# Patient Record
Sex: Female | Born: 1980 | Race: White | Hispanic: No | Marital: Married | Smoking: Never smoker
Health system: Southern US, Community
[De-identification: ages and names within clinical notes are randomized; demographics above are authoritative.]

## PROBLEM LIST (undated history)

## (undated) DIAGNOSIS — M419 Scoliosis, unspecified: Secondary | ICD-10-CM

## (undated) HISTORY — PX: SPINAL FUSION: SHX223

## (undated) HISTORY — DX: Scoliosis, unspecified: M41.9

---

## 2012-07-11 NOTE — L&D Delivery Note (Signed)
Delivery Note At 10:22 PM a viable and healthy female was delivered via Vaginal, Spontaneous Delivery (Presentation: Right Occiput Anterior).  APGAR: 8, 9; weight .   Placenta status: Intact, Spontaneous.  Cord: 3 vessels with the following complications: Short.  Anesthesia: None  Episiotomy: None Lacerations: None Suture Repair: n/a Est. Blood Loss (mL): 200  Pt is 32 yo G3 now P3003 who presented with SOOL and now s/p NSVD. 3rd stage of labor was completed with traction and intact 3 vessel cord was delivered. No lacerations visualized. Hemostasis was achieved with fundal massage prior to leaving room. Counts correct.  Mom to postpartum.  Baby to Couplet care / Skin to Skin.  Pior, Jearld Lesch 06/25/2013, 10:42 PM  I was present for the delivery and agree with above.  [place], CNM 06/26/2013 12:35 AM

## 2013-02-27 ENCOUNTER — Encounter: Payer: Self-pay | Admitting: Obstetrics and Gynecology

## 2013-02-27 ENCOUNTER — Ambulatory Visit (INDEPENDENT_AMBULATORY_CARE_PROVIDER_SITE_OTHER): Payer: PRIVATE HEALTH INSURANCE | Admitting: Obstetrics and Gynecology

## 2013-02-27 VITALS — BP 119/72 | Ht 63.0 in | Wt 140.0 lb

## 2013-02-27 DIAGNOSIS — Z3482 Encounter for supervision of other normal pregnancy, second trimester: Secondary | ICD-10-CM

## 2013-02-27 DIAGNOSIS — Z3480 Encounter for supervision of other normal pregnancy, unspecified trimester: Secondary | ICD-10-CM | POA: Insufficient documentation

## 2013-02-27 NOTE — Progress Notes (Signed)
Patient moved from [place] with prenatal records which have been reviewed. Some information missing such as pap smear and anatomy scan. Per report both are normal. Patient is currently without any complaints. Reviewed scope pf our practise. All questions were answered.

## 2013-02-27 NOTE — Progress Notes (Signed)
Patient recently moved here from [place] where she received prenatal care.  She was diagnosed with subchorionic hemorhage. Marland Kitchen

## 2013-03-26 ENCOUNTER — Ambulatory Visit (INDEPENDENT_AMBULATORY_CARE_PROVIDER_SITE_OTHER): Payer: PRIVATE HEALTH INSURANCE | Admitting: Obstetrics & Gynecology

## 2013-03-26 ENCOUNTER — Encounter: Payer: Self-pay | Admitting: Obstetrics & Gynecology

## 2013-03-26 VITALS — BP 128/77 | Wt 139.0 lb

## 2013-03-26 DIAGNOSIS — Z1389 Encounter for screening for other disorder: Secondary | ICD-10-CM

## 2013-03-26 DIAGNOSIS — Z0489 Encounter for examination and observation for other specified reasons: Secondary | ICD-10-CM

## 2013-03-26 DIAGNOSIS — Z3480 Encounter for supervision of other normal pregnancy, unspecified trimester: Secondary | ICD-10-CM

## 2013-03-26 DIAGNOSIS — Z3482 Encounter for supervision of other normal pregnancy, second trimester: Secondary | ICD-10-CM

## 2013-03-26 NOTE — Progress Notes (Signed)
P-[id number]  

## 2013-03-26 NOTE — Progress Notes (Signed)
Counseled about Tdap and flu vaccines, patient will think about them and decide by next visit. 1 hr GTT, labs next visit. Ultrasound ordered given inadequate anatomy scan at her previous OB office.  No other complaints or concerns.  Fetal movement and labor precautions reviewed.

## 2013-03-26 NOTE — Patient Instructions (Signed)
Tetanus, Diphtheria (Td) or Tetanus, Diphtheria, Pertussis (Tdap) Vaccine What You Need to Know WHY GET VACCINATED? Tetanus, diphtheria and pertussis can be very serious diseases. TETANUS (Lockjaw) causes painful muscle spasms and stiffness, usually all over the body.  Tetanus can lead to tightening of muscles in the head and neck so the victim cannot open his mouth or swallow, or sometimes even breathe. Tetanus kills about 1 out of 5 people who are infected. DIPHTHERIA can cause a thick membrane to cover the back of the throat.  Diphtheria can lead to breathing problems, paralysis, heart failure, and even death. PERTUSSIS (Whooping Cough) causes severe coughing spells which can lead to difficulty breathing, vomiting, and disturbed sleep.  Pertussis can lead to weight loss, incontinence, rib fractures and passing out from violent coughing. Up to 2 in 100 adolescents and 5 in 100 adults with pertussis are hospitalized or have complications, including pneumonia and death. These 3 diseases are all caused by bacteria. Diphtheria and pertussis are spread from person to person. Tetanus enters the body through cuts, scratches, or wounds. The [place] saw as many as 200,000 cases a year of diphtheria and pertussis before vaccines were available, and hundreds of cases of tetanus. Since then, tetanus and diphtheria cases have dropped by about 99% and pertussis cases by about 92%. Children 6 years of age and younger get DTaP vaccine to protect them from these three diseases. But older children, adolescents, and adults need protection too. VACCINES FOR ADOLESCENTS AND ADULTS: TD AND TDAP Two vaccines are available to protect people 7 years of age and older from these diseases:  Td vaccine has been used for many years. It protects against tetanus and diphtheria.  Tdap vaccine was licensed in 2005. It is the first vaccine for adolescents and adults that protects against pertussis as well as tetanus and  diphtheria. A Td booster dose is recommended every 10 years. Tdap is given only once. WHICH VACCINE, AND WHEN? Ages 7 through 18 years  A dose of Tdap is recommended at age 11 or 12. This dose could be given as early as age 7 for children who missed one or more childhood doses of DTaP.  Children and adolescents who did not get a complete series of DTaP shots by age 7 should complete the series using a combination of Td and Tdap. Age 19 years and Older  All adults should get a booster dose of Td every 10 years. Adults under 65 who have never gotten Tdap should get a dose of Tdap as their next booster dose. Adults 65 and older may get one booster dose of Tdap.  Adults (including women who may become pregnant and adults 65 and older) who expect to have close contact with a baby younger than 12 months of age should get a dose of Tdap to help protect the baby from pertussis.  Healthcare professionals who have direct patient contact in hospitals or clinics should get one dose of Tdap. Protection After a Wound  A person who gets a severe cut or burn might need a dose of Td or Tdap to prevent tetanus infection. Tdap should be used for anyone who has never had a dose previously. Td should be used if Tdap is not available, or for:  Anybody who has already had a dose of Tdap.  Children 7 through 9 years of age who completed the childhood DTaP series.  Adults 65 and older. Pregnant Women  Pregnant women who have never had a dose of Tdap   should get one, after the 20th week of gestation and preferably during the 3rd trimester. If they do not get Tdap during their pregnancy they should get a dose as soon as possible after delivery. Pregnant women who have previously received Tdap and need tetanus or diphtheria vaccine while pregnant should get Td. Tdap and Td may be given at the same time as other vaccines. SOME PEOPLE SHOULD NOT BE VACCINATED OR SHOULD WAIT  Anyone who has had a life-threatening  allergic reaction after a dose of any tetanus, diphtheria, or pertussis containing vaccine should not get Td or Tdap.  Anyone who has a severe allergy to any component of a vaccine should not get that vaccine. Tell your doctor if the person getting the vaccine has any severe allergies.  Anyone who had a coma, or long or multiple seizures within 7 days after a dose of DTP or DTaP should not get Tdap, unless a cause other than the vaccine was found. These people may get Td.  Talk to your doctor if the person getting either vaccine:  Has epilepsy or another nervous system problem.  Had severe swelling or severe pain after a previous dose of DTP, DTaP, DT, Td, or Tdap vaccine.  Has had Guillain Barr Syndrome (GBS). Anyone who has a moderate or severe illness on the day the shot is scheduled should usually wait until they recover before getting Tdap or Td vaccine. A person with a mild illness or low fever can usually be vaccinated. WHAT ARE THE RISKS FROM TDAP AND TD VACCINES? With a vaccine, as with any medicine, there is always a small risk of a life-threatening allergic reaction or other serious problem. Brief fainting spells and related symptoms (such as jerking movements) can happen after any medical procedure, including vaccination. Sitting or lying down for about 15 minutes after a vaccination can help prevent fainting and injuries caused by falls. Tell your doctor if the patient feels dizzy or lightheaded, or has vision changes or ringing in the ears. Getting tetanus, diphtheria, or pertussis would be much more likely to lead to severe problems than getting either Td or Tdap vaccine. Problems reported after Td and Tdap vaccines are listed below. Mild Problems (noticeable, but did not interfere with activities) Tdap  Pain (about 3 in 4 adolescents and 2 in 3 adults).  Redness or swelling (about 1 in 5).  Mild fever of at least 100.4 F (38 C) (up to about 1 in 25 adolescents and 1 in  100 adults).  Headache (about 4 in 10 adolescents and 3 in 10 adults).  Tiredness (about 1 in 3 adolescents and 1 in 4 adults).  Nausea, vomiting, diarrhea, or stomach ache (up to 1 in 4 adolescents and 1 in 10 adults).  Chills, body aches, sore joints, rash, or swollen glands (uncommon). Td  Pain (up to about 8 in 10).  Redness or swelling at the injection site (up to about 1 in 3).  Mild fever (up to about 1 in 15).  Headache or tiredness (uncommon). Moderate Problems (interfered with activities, but did not require medical attention) Tdap  Pain at the injection site (about 1 in 20 adolescents and 1 in 100 adults).  Redness or swelling at the injection site (up to about 1 in 16 adolescents and 1 in 25 adults).  Fever over 102 F (38.9 C) (about 1 in 100 adolescents and 1 in 250 adults).  Headache (1 in 300).  Nausea, vomiting, diarrhea, or stomach ache (up to 3   in 100 adolescents and 1 in 100 adults). Td  Fever over 102 F (38.9 C) (rare). Tdap or Td  Extensive swelling of the arm where the shot was given (up to about 3 in 100). Severe Problems (Unable to perform usual activities; required medical attention) Tdap or Td  Swelling, severe pain, bleeding, and redness in the arm where the shot was given (rare). A severe allergic reaction could occur after any vaccine. They are estimated to occur less than once in a million doses. WHAT IF THERE IS A SEVERE REACTION? What should I look for? Any unusual condition, such as a severe allergic reaction or a high fever. If a severe allergic reaction occurred, it would be within a few minutes to an hour after the shot. Signs of a serious allergic reaction can include difficulty breathing, weakness, hoarseness or wheezing, a fast heartbeat, hives, dizziness, paleness, or swelling of the throat. What should I do?  Call a doctor, or get the person to a doctor right away.  Tell your doctor what happened, the date and time it  happened, and when the vaccination was given.  Ask your provider to report the reaction by filing a Vaccine Adverse Event Reporting System (VAERS) form. Or, you can file this report through the VAERS website at www.vaers.hhs.gov or by calling 1-800-822-7967. VAERS does not provide medical advice. THE NATIONAL VACCINE INJURY COMPENSATION PROGRAM The National Vaccine Injury Compensation Program (VICP) was created in 1986. Persons who believe they may have been injured by a vaccine can learn about the program and about filing a claim by calling 1-800-338-2382 or visiting the VICP website at www.hrsa.gov/vaccinecompensation. HOW CAN I LEARN MORE?  Your doctor can give you the vaccine package insert or suggest other sources of information.  Call your local or state health department.  Contact the Centers for Disease Control and Prevention (CDC):  Call 1-800-232-4636 (1-800-CDC-INFO).  Visit the CDC website at www.cdc.gov/vaccines. CDC Td and Tdap Interim VIS (08/03/10) Document Released: 04/24/2006 Document Revised: 09/19/2011 Document Reviewed: 08/03/2010 Exit[employer] Patient Information 2014 [employer].  

## 2013-04-02 ENCOUNTER — Other Ambulatory Visit: Payer: Self-pay | Admitting: Obstetrics & Gynecology

## 2013-04-02 ENCOUNTER — Ambulatory Visit (HOSPITAL_COMMUNITY)
Admission: RE | Admit: 2013-04-02 | Discharge: 2013-04-02 | Disposition: A | Discharge status: Home / Self Care | Payer: 59 | Source: Ambulatory Visit | Attending: Obstetrics & Gynecology | Admitting: Obstetrics & Gynecology

## 2013-04-02 DIAGNOSIS — Z0489 Encounter for examination and observation for other specified reasons: Secondary | ICD-10-CM

## 2013-04-02 DIAGNOSIS — Z3482 Encounter for supervision of other normal pregnancy, second trimester: Secondary | ICD-10-CM

## 2013-04-02 DIAGNOSIS — Z363 Encounter for antenatal screening for malformations: Secondary | ICD-10-CM | POA: Insufficient documentation

## 2013-04-02 DIAGNOSIS — Z1389 Encounter for screening for other disorder: Secondary | ICD-10-CM | POA: Insufficient documentation

## 2013-04-02 DIAGNOSIS — O358XX0 Maternal care for other (suspected) fetal abnormality and damage, not applicable or unspecified: Secondary | ICD-10-CM | POA: Insufficient documentation

## 2013-04-03 ENCOUNTER — Encounter: Payer: Self-pay | Admitting: Obstetrics & Gynecology

## 2013-04-09 ENCOUNTER — Encounter: Payer: Self-pay | Admitting: Obstetrics & Gynecology

## 2013-04-09 ENCOUNTER — Ambulatory Visit (INDEPENDENT_AMBULATORY_CARE_PROVIDER_SITE_OTHER): Payer: PRIVATE HEALTH INSURANCE | Admitting: Obstetrics & Gynecology

## 2013-04-09 VITALS — BP 126/73 | Wt 141.0 lb

## 2013-04-09 DIAGNOSIS — Z3480 Encounter for supervision of other normal pregnancy, unspecified trimester: Secondary | ICD-10-CM

## 2013-04-09 DIAGNOSIS — Z3482 Encounter for supervision of other normal pregnancy, second trimester: Secondary | ICD-10-CM

## 2013-04-09 LAB — CBC
MCH: 30.8 pg (ref 26.0–34.0)
MCHC: 34 g/dL (ref 30.0–36.0)
Platelets: 235 10*3/uL (ref 150–400)
RBC: 3.9 MIL/uL (ref 3.87–5.11)
WBC: 7.3 10*3/uL (ref 4.0–10.5)

## 2013-04-09 NOTE — Patient Instructions (Signed)
Influenza A (H1N1) in Pregnancy °H1N1 formerly called "swine flu" is a new influenza virus causing sickness in people. The H1N1 virus is different from seasonal influenza viruses. However, the H1N1 symptoms are similar to seasonal influenza, and it is easily spread from person to person.  °Pregnancy weakens the immune system, making it easier to catch infections and the H1N1 virus. Also, as the baby grows, the mother has less lung function. When there is less lung function, the mother is more likely to suffer from pneumonia, have kidney failure or a blood clot to the lung if they catch the flu. Currently, a vaccine is being produced to protect people from getting the H1N1 flu. It is safe for the mother and fetus. Pregnant women should not take the nasal mist vaccine because the spray contains the live virus, even though it's strength is weakened (attenuated). If you are pregnant and think you have H1N1, call your caregiver right away. The CDC and the World Health Organization are following reported cases around the world.  °CAUSES °· The flu is thought to spread mainly person-to-person through coughing or sneezing of infected people. °· A person may become infected by touching something with the virus on it and then touching their mouth or nose. °SYMPTOMS  °· Fever. °· Headache. °· Tiredness. °· Cough. °· Sore throat. °· Runny or stuffy nose. °· Body aches. °· Diarrhea and vomiting °These symptoms are referred to as "flu-like symptoms." A lot of different illnesses, including the common cold, may have similar symptoms. °DIAGNOSIS  °· There are tests that can tell if you have the H1N1 virus. °· Confirmed cases of H1N1 will be reported to the state or local health department. °· A doctor's exam may be needed to tell whether you have an infection that is a complication of the flu. °TREATMENT  °· Start treatment as soon as possible when symptoms occur. °· Get a lot of sleep and rest. °· Drink a lot of fluids. °· Eat a  balanced diet. °· Take your vitamins and mineral supplements as recommended. °· Only take over-the-counter or prescription medicines as directed by your caregiver. °· Take medication, Tamiflu or Relenza that help fight the flu with the permission of your caregiver. They are safe to take when pregnant. °PREVENTION  °· Cover your nose and mouth with a tissue or your arm when you cough or sneeze. Throw the tissue away. °· Wash your hands often with soap and warm water, especially after you cough or sneeze. Alcohol-based cleaners are also effective against germs. °· Avoid touching your eyes, nose or mouth. This is one way germs spread. °· Try to avoid contact with sick people. Follow public health advice regarding school closures. Avoid crowds. °· Stay home if you get sick. Limit contact with others to keep from infecting them. People infected with the H1N1 virus may be able to infect others anywhere from 1 day before feeling sick to 5-7 days after getting flu symptoms. °· An H1N1 vaccine is available to help protect against the virus. In addition to the H1N1 vaccine, you will need to be vaccinated for seasonal influenza. The H1N1 and seasonal vaccines may be given on the same day. °FACEMASKS °In community and home settings, the use of facemasks and N95 respirators are not normally recommended. In certain circumstances, a facemask or N95 respirator may be used for persons at increased risk of severe illness from influenza. Your caregiver can give additional recommendations for facemask use. °HOME CARE INSTRUCTIONS  °· Stay   informed. Visit the CDC website for current recommendations. Visit www.cdc.gov/H1N1flu/. You may also call 1-800-CDC-INFO (1-800-232-4636). °· If you are pregnant, talk to your caregiver as soon as you develop flu-like symptoms. °· If you get the flu, get plenty of rest, drink enough water and fluids to keep your urine clear or pale yellow, and avoid using alcohol or tobacco. °· You may take  over-the-counter medicine to relieve the symptoms of the flu if your caregiver approves. °· Avoid mingling in large crowds and crowed places. °· Wash your hands with soap and warm water especially after coughing or sneezing. °· Cover you face when coughing or sneezing. °· Wear a mask and stay at a good distance if someone in your family has the swine flu. °· Avoid touching your eyes, nose and mouth. °· Avoid people who are sick. °· Stay home from work or school if you are getting sick. °SEEK MEDICAL CARE IF:  °· You feel like you are getting the flu, see the symptoms stated above. °· If you develop a temperature a fever with or without chills. °· You think someone in your family is getting the flu. °· You have come in contact with someone who has the swine flu. °· You want advice on what medications are safe to take or you need a prescription for medication. °SEEK IMMEDIATE MEDICAL CARE IF:  °· Your flu-like symptoms improve but return with fever and worse cough. °· You develop a temperature of 102° F (38.9° C) or higher. °· You are short of breath or have a hard time breathing. °· You have vaginal bleeding. °· You do not feel the baby moving or the baby is moving less than usual. °· You develop uterine contractions. °· You have leaking or a gush of fluid from the vagina. °· You develop severe or persistent vomiting. °· You feel dizzy, confused or turn bluish in color. °· You have pain or pressure in the chest or abdomen. °Document Released: 04/29/2008 Document Revised: 09/19/2011 Document Reviewed: 04/29/2008 °[employer]® Patient Information ©2014 [employer]. ° °

## 2013-04-09 NOTE — Progress Notes (Signed)
Routine visit. Good FM. Declines TDAP and flu vaccine, understands risks. 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 Glucola and labs today. She declines a third anatomy u/s to follow up on the face and heart. She tells me that she has very rapid deliveries ( on the order of an hour). I explained that we do our deliveries at [facility]. She is aware of this.

## 2013-04-10 LAB — RPR

## 2013-04-23 ENCOUNTER — Ambulatory Visit (INDEPENDENT_AMBULATORY_CARE_PROVIDER_SITE_OTHER): Payer: PRIVATE HEALTH INSURANCE | Admitting: Obstetrics & Gynecology

## 2013-04-23 VITALS — BP 129/79 | Wt 144.0 lb

## 2013-04-23 DIAGNOSIS — Z3480 Encounter for supervision of other normal pregnancy, unspecified trimester: Secondary | ICD-10-CM

## 2013-04-23 DIAGNOSIS — Z3483 Encounter for supervision of other normal pregnancy, third trimester: Secondary | ICD-10-CM

## 2013-04-23 NOTE — Patient Instructions (Signed)
Return to clinic for any obstetric concerns or go to [facility] for evaluation  

## 2013-04-23 NOTE — Progress Notes (Signed)
Inadequate views of heart and face at 26 weeks, patient declines rescan.  No other complaints or concerns.  Fetal movement and labor precautions reviewed.

## 2013-04-23 NOTE — Progress Notes (Signed)
P=93 

## 2013-05-07 ENCOUNTER — Encounter: Payer: Self-pay | Admitting: Family Medicine

## 2013-05-07 ENCOUNTER — Ambulatory Visit (INDEPENDENT_AMBULATORY_CARE_PROVIDER_SITE_OTHER): Payer: PRIVATE HEALTH INSURANCE | Admitting: Family Medicine

## 2013-05-07 VITALS — BP 123/78 | Wt 145.0 lb

## 2013-05-07 DIAGNOSIS — Z3483 Encounter for supervision of other normal pregnancy, third trimester: Secondary | ICD-10-CM

## 2013-05-07 DIAGNOSIS — Z3480 Encounter for supervision of other normal pregnancy, unspecified trimester: Secondary | ICD-10-CM

## 2013-05-07 NOTE — Progress Notes (Signed)
P = 87 

## 2013-05-07 NOTE — Progress Notes (Signed)
Doing well. No complaints today.

## 2013-05-07 NOTE — Patient Instructions (Signed)
Pregnancy - Third Trimester The third trimester of pregnancy (the last 3 months) is a period of the most rapid growth for you and your baby. The baby approaches a length of 20 inches and a weight of 6 to 10 pounds. The baby is adding on fat and getting ready for life outside your body. While inside, babies have periods of sleeping and waking, sucking thumbs, and hiccuping. You can often feel small contractions of the uterus. This is false labor. It is also called Braxton-Hicks contractions. This is like a practice for labor. The usual problems in this stage of pregnancy include more difficulty breathing, swelling of the hands and feet from water retention, and having to urinate more often because of the uterus and baby pressing on your bladder.  PRENATAL EXAMS  Blood work may continue to be done during prenatal exams. These tests are done to check on your health and the probable health of your baby. Blood work is used to follow your blood levels (hemoglobin). Anemia (low hemoglobin) is common during pregnancy. Iron and vitamins are given to help prevent this. You may also continue to be checked for diabetes. Some of the past blood tests may be done again.  The size of the uterus is measured during each visit. This makes sure your baby is growing properly according to your pregnancy dates.  Your blood pressure is checked every prenatal visit. This is to make sure you are not getting toxemia.  Your urine is checked every prenatal visit for infection, diabetes, and protein.  Your weight is checked at each visit. This is done to make sure gains are happening at the suggested rate and that you and your baby are growing normally.  Sometimes, an ultrasound is performed to confirm the position and the proper growth and development of the baby. This is a test done that bounces harmless sound waves off the baby so your caregiver can more accurately determine a due date.  Discuss the type of pain medicine and  anesthesia you will have during your labor and delivery.  Discuss the possibility and anesthesia if a cesarean section might be necessary.  Inform your caregiver if there is any mental or physical violence at home. Sometimes, a specialized non-stress test, contraction stress test, and biophysical profile are done to make sure the baby is not having a problem. Checking the amniotic fluid surrounding the baby is called an amniocentesis. The amniotic fluid is removed by sticking a needle into the belly (abdomen). This is sometimes done near the end of pregnancy if an early delivery is required. In this case, it is done to help make sure the baby's lungs are mature enough for the baby to live outside of the womb. If the lungs are not mature and it is unsafe to deliver the baby, an injection of cortisone medicine is given to the mother 1 to 2 days before the delivery. This helps the baby's lungs mature and makes it safer to deliver the baby. CHANGES OCCURING IN THE THIRD TRIMESTER OF PREGNANCY Your body goes through many changes during pregnancy. They vary from person to person. Talk to your caregiver about changes you notice and are concerned about.  During the last trimester, you have probably had an increase in your appetite. It is normal to have cravings for certain foods. This varies from person to person and pregnancy to pregnancy.  You may begin to get stretch marks on your hips, abdomen, and breasts. These are normal changes in the body   during pregnancy. There are no exercises or medicines to take which prevent this change.  Constipation may be treated with a stool softener or adding bulk to your diet. Drinking lots of fluids, fiber in vegetables, fruits, and whole grains are helpful.  Exercising is also helpful. If you have been very active up until your pregnancy, most of these activities can be continued during your pregnancy. If you have been less active, it is helpful to start an exercise  program such as walking. Consult your caregiver before starting exercise programs.  Avoid all smoking, alcohol, non-prescribed drugs, herbs and "street drugs" during your pregnancy. These chemicals affect the formation and growth of the baby. Avoid chemicals throughout the pregnancy to ensure the delivery of a healthy infant.  Backache, varicose veins, and hemorrhoids may develop or get worse.  You will tire more easily in the third trimester, which is normal.  The baby's movements may be stronger and more often.  You may become short of breath easily.  Your belly button may stick out.  A yellow discharge may leak from your breasts called colostrum.  You may have a bloody mucus discharge. This usually occurs a few days to a week before labor begins. HOME CARE INSTRUCTIONS   Keep your caregiver's appointments. Follow your caregiver's instructions regarding medicine use, exercise, and diet.  During pregnancy, you are providing food for you and your baby. Continue to eat regular, well-balanced meals. Choose foods such as meat, fish, milk and other low fat dairy products, vegetables, fruits, and whole-grain breads and cereals. Your caregiver will tell you of the ideal weight gain.  A physical sexual relationship may be continued throughout pregnancy if there are no other problems such as early (premature) leaking of amniotic fluid from the membranes, vaginal bleeding, or belly (abdominal) pain.  Exercise regularly if there are no restrictions. Check with your caregiver if you are unsure of the safety of your exercises. Greater weight gain will occur in the last 2 trimesters of pregnancy. Exercising helps:  Control your weight.  Get you in shape for labor and delivery.  You lose weight after you deliver.  Rest a lot with legs elevated, or as needed for leg cramps or low back pain.  Wear a good support or jogging bra for breast tenderness during pregnancy. This may help if worn during  sleep. Pads or tissues may be used in the bra if you are leaking colostrum.  Do not use hot tubs, steam rooms, or saunas.  Wear your seat belt when driving. This protects you and your baby if you are in an accident.  Avoid raw meat, cat litter boxes and soil used by cats. These carry germs that can cause birth defects in the baby.  It is easier to leak urine during pregnancy. Tightening up and strengthening the pelvic muscles will help with this problem. You can practice stopping your urination while you are going to the bathroom. These are the same muscles you need to strengthen. It is also the muscles you would use if you were trying to stop from passing gas. You can practice tightening these muscles up 10 times a set and repeating this about 3 times per day. Once you know what muscles to tighten up, do not perform these exercises during urination. It is more likely to cause an infection by backing up the urine.  Ask for help if you have financial, counseling, or nutritional needs during pregnancy. Your caregiver will be able to offer counseling for these   needs as well as refer you for other special needs.  Make a list of emergency phone numbers and have them available.  Plan on getting help from family or friends when you go home from the hospital.  Make a trial run to the hospital.  Take prenatal classes with the father to understand, practice, and ask questions about the labor and delivery.  Prepare the baby's room or nursery.  Do not travel out of the city unless it is absolutely necessary and with the advice of your caregiver.  Wear only low or no heal shoes to have better balance and prevent falling. MEDICINES AND DRUG USE IN PREGNANCY  Take prenatal vitamins as directed. The vitamin should contain 1 milligram of folic acid. Keep all vitamins out of reach of children. Only a couple vitamins or tablets containing iron may be fatal to a baby or young child when ingested.  Avoid use  of all medicines, including herbs, over-the-counter medicines, not prescribed or suggested by your caregiver. Only take over-the-counter or prescription medicines for pain, discomfort, or fever as directed by your caregiver. Do not use aspirin, ibuprofen or naproxen unless approved by your caregiver.  Let your caregiver also know about herbs you may be using.  Alcohol is related to a number of birth defects. This includes fetal alcohol syndrome. All alcohol, in any form, should be avoided completely. Smoking will cause low birth rate and premature babies.  Illegal drugs are very harmful to the baby. They are absolutely forbidden. A baby born to an addicted mother will be addicted at birth. The baby will go through the same withdrawal an adult does. SEEK MEDICAL CARE IF: You have any concerns or worries during your pregnancy. It is better to call with your questions if you feel they cannot wait, rather than worry about them. SEEK IMMEDIATE MEDICAL CARE IF:   An unexplained oral temperature above 102 F (38.9 C) develops, or as your caregiver suggests.  You have leaking of fluid from the vagina. If leaking membranes are suspected, take your temperature and tell your caregiver of this when you call.  There is vaginal spotting, bleeding or passing clots. Tell your caregiver of the amount and how many pads are used.  You develop a bad smelling vaginal discharge with a change in the color from clear to white.  You develop vomiting that lasts more than 24 hours.  You develop chills or fever.  You develop shortness of breath.  You develop burning on urination.  You loose more than 2 pounds of weight or gain more than 2 pounds of weight or as suggested by your caregiver.  You notice sudden swelling of your face, hands, and feet or legs.  You develop belly (abdominal) pain. Round ligament discomfort is a common non-cancerous (benign) cause of abdominal pain in pregnancy. Your caregiver still  must evaluate you.  You develop a severe headache that does not go away.  You develop visual problems, blurred or double vision.  If you have not felt your baby move for more than 1 hour. If you think the baby is not moving as much as usual, eat something with sugar in it and lie down on your left side for an hour. The baby should move at least 4 to 5 times per hour. Call right away if your baby moves less than that.  You fall, are in a car accident, or any kind of trauma.  There is mental or physical violence at home. Document Released: 06/21/2001   Document Revised: 03/21/2012 Document Reviewed: 12/24/2008 [employer] Patient Information 2014 [employer].  Breastfeeding A change in hormones during your pregnancy causes growth of your breast tissue and an increase in number and size of milk ducts. The hormone prolactin allows proteins, sugars, and fats from your blood supply to make breast milk in your milk-producing glands. The hormone progesterone prevents breast milk from being released before the birth of your baby. After the birth of your baby, your progesterone level decreases allowing breast milk to be released. Thoughts of your baby, as well as his or her sucking or crying, can stimulate the release of milk from the milk-producing glands. Deciding to breastfeed (nurse) is one of the best choices you can make for you and your baby. The information that follows gives a brief review of the benefits, as well as other important skills to know about breastfeeding. BENEFITS OF BREASTFEEDING For your baby  The first milk (colostrum) helps your baby's digestive system function better.   There are antibodies in your milk that help your baby fight off infections.   Your baby has a lower incidence of asthma, allergies, and sudden infant death syndrome (SIDS).   The nutrients in breast milk are better for your baby than infant formulas.  Breast milk improves your baby's brain development.    Your baby will have less gas, colic, and constipation.  Your baby is less likely to develop other conditions, such as childhood obesity, asthma, or diabetes mellitus. For you  Breastfeeding helps develop a very special bond between you and your baby.   Breastfeeding is convenient, always available at the correct temperature, and costs nothing.   Breastfeeding helps to burn calories and helps you lose the weight gained during pregnancy.   Breastfeeding makes your uterus contract back down to normal size faster and slows bleeding following delivery.   Breastfeeding mothers have a lower risk of developing osteoporosis or breast or ovarian cancer later in life.  BREASTFEEDING FREQUENCY  A healthy, full-term baby may breastfeed as often as every hour or space his or her feedings to every 3 hours. Breastfeeding frequency will vary from baby to baby.   Newborns should be fed no less than every 2 3 hours during the day and every 4 5 hours during the night. You should breastfeed a minimum of 8 feedings in a 24 hour period.  Awaken your baby to breastfeed if it has been 3 4 hours since the last feeding.  Breastfeed when you feel the need to reduce the fullness of your breasts or when your newborn shows signs of hunger. Signs that your baby may be hungry include:  Increased alertness or activity.  Stretching.  Movement of the head from side to side.  Movement of the head and opening of the mouth when the corner of the mouth or cheek is stroked (rooting).  Increased sucking sounds, smacking lips, cooing, sighing, or squeaking.  Hand-to-mouth movements.  Increased sucking of fingers or hands.  Fussing.  Intermittent crying.  Signs of extreme hunger will require calming and consoling before you try to feed your baby. Signs of extreme hunger may include:  Restlessness.  A loud, strong cry.  Screaming.  Frequent feeding will help you make more milk and will help prevent  problems, such as sore nipples and engorgement of the breasts.  BREASTFEEDING   Whether lying down or sitting, be sure that the baby's abdomen is facing your abdomen.   Support your breast with 4 fingers under your breast   and your thumb above your nipple. Make sure your fingers are well away from your nipple and your baby's mouth.   Stroke your baby's lips gently with your finger or nipple.   When your baby's mouth is open wide enough, place all of your nipple and as much of the colored area around your nipple (areola) as possible into your baby's mouth.  More areola should be visible above his or her upper lip than below his or her lower lip.  Your baby's tongue should be between his or her lower gum and your breast.  Ensure that your baby's mouth is correctly positioned around the nipple (latched). Your baby's lips should create a seal on your breast.  Signs that your baby has effectively latched onto your nipple include:  Tugging or sucking without pain.  Swallowing heard between sucks.  Absent click or smacking sound.  Muscle movement above and in front of his or her ears with sucking.  Your baby must suck about 2 3 minutes in order to get your milk. Allow your baby to feed on each breast as long as he or she wants. Nurse your baby until he or she unlatches or falls asleep at the first breast, then offer the second breast.  Signs that your baby is full and satisfied include:  A gradual decrease in the number of sucks or complete cessation of sucking.  Falling asleep.  Extension or relaxation of his or her body.  Retention of a small amount of milk in his or her mouth.  Letting go of your breast by himself or herself.  Signs of effective breastfeeding in you include:  Breasts that have increased firmness, weight, and size prior to feeding.  Breasts that are softer after nursing.  Increased milk volume, as well as a change in milk consistency and color by the 5th  day of breastfeeding.  Breast fullness relieved by breastfeeding.  Nipples are not sore, cracked, or bleeding.  If needed, break the suction by putting your finger into the corner of your baby's mouth and sliding your finger between his or her gums. Then, remove your breast from his or her mouth.  It is common for babies to spit up a small amount after a feeding.  Babies often swallow air during feeding. This can make babies fussy. Burping your baby between breasts can help with this.  Vitamin D supplements are recommended for babies who get only breast milk.  Avoid using a pacifier during your baby's first 4 6 weeks.  Avoid supplemental feedings of water, formula, or juice in place of breastfeeding. Breast milk is all the food your baby needs. It is not necessary for your baby to have water or formula. Your breasts will make more milk if supplemental feedings are avoided during the early weeks. HOW TO TELL WHETHER YOUR BABY IS GETTING ENOUGH BREAST MILK Wondering whether or not your baby is getting enough milk is a common concern among mothers. You can be assured that your baby is getting enough milk if:   Your baby is actively sucking and you hear swallowing.   Your baby seems relaxed and satisfied after a feeding.   Your baby nurses at least 8 12 times in a 24 hour time period.  During the first 3 5 days of age:  Your baby is wetting at least 3 5 diapers in a 24 hour period. The urine should be clear and pale yellow.  Your baby is having at least 3 4 stools in   a 24 hour period. The stool should be soft and yellow.  At 5 7 days of age, your baby is having at least 3 6 stools in a 24 hour period. The stool should be seedy and yellow by 5 days of age.  Your baby has a weight loss less than 7 10% during the first 3 days of age.  Your baby does not lose weight after 3 7 days of age.  Your baby gains 4 7 ounces each week after he or she is 4 days of age.  Your baby gains weight  by 5 days of age and is back to birth weight within 2 weeks. ENGORGEMENT In the first week after your baby is born, you may experience extremely full breasts (engorgement). When engorged, your breasts may feel heavy, warm, or tender to the touch. Engorgement peaks within 24 48 hours after delivery of your baby.  Engorgement may be reduced by:  Continuing to breastfeed.  Increasing the frequency of breastfeeding.  Taking warm showers or applying warm, moist heat to your breasts just before each feeding. This increases circulation and helps the milk flow.   Gently massaging your breast before and during the feedings. With your fingertips, massage from your chest wall towards your nipple in a circular motion.   Ensuring that your baby empties at least one breast at every feeding. It also helps to start the next feeding on the opposite breast.   Expressing breast milk by hand or by using a breast pump to empty the breasts if your baby is sleepy, or not nursing well. You may also want to express milk if you are returning to work oryou feel you are getting engorged.  Ensuring your baby is latched on and positioned properly while breastfeeding. If you follow these suggestions, your engorgement should improve in 24 48 hours. If you are still experiencing difficulty, call your lactation consultant or caregiver.  CARING FOR YOURSELF Take care of your breasts.  Bathe or shower daily.   Avoid using soap on your nipples.   Wear a supportive bra. Avoid wearing underwire style bras.  Air dry your nipples for a 3 4minutes after each feeding.   Use only cotton bra pads to absorb breast milk leakage. Leaking of breast milk between feedings is normal.   Use only pure lanolin on your nipples after nursing. You do not need to wash it off before feeding your baby again. Another option is to express a few drops of breast milk and gently massage that milk into your nipples.  Continue breast  self-awareness checks. Take care of yourself.  Eat healthy foods. Alternate 3 meals with 3 snacks.  Avoid foods that you notice affect your baby in a bad way.  Drink milk, fruit juice, and water to satisfy your thirst (about 8 glasses a day).   Rest often, relax, and take your prenatal vitamins to prevent fatigue, stress, and anemia.  Avoid chewing and smoking tobacco.  Avoid alcohol and drug use.  Take over-the-counter and prescribed medicine only as directed by your caregiver or pharmacist. You should always check with your caregiver or pharmacist before taking any new medicine, vitamin, or herbal supplement.  Know that pregnancy is possible while breastfeeding. If desired, talk to your caregiver about family planning and safe birth control methods that may be used while breastfeeding. SEEK MEDICAL CARE IF:   You feel like you want to stop breastfeeding or have become frustrated with breastfeeding.  You have painful breasts or nipples.    Your nipples are cracked or bleeding.  Your breasts are red, tender, or warm.  You have a swollen area on either breast.  You have a fever or chills.  You have nausea or vomiting.  You have drainage from your nipples.  Your breasts do not become full before feedings by the 5th day after delivery.  You feel sad and depressed.  Your baby is too sleepy to eat well.  Your baby is having trouble sleeping.   Your baby is wetting less than 3 diapers in a 24 hour period.  Your baby has less than 3 stools in a 24 hour period.  Your baby's skin or the white part of his or her eyes becomes more yellow.   Your baby is not gaining weight by 5 days of age. MAKE SURE YOU:   Understand these instructions.  Will watch your condition.  Will get help right away if you are not doing well or get worse. Document Released: 06/27/2005 Document Revised: 03/21/2012 Document Reviewed: 02/01/2012 [employer] Patient Information 2014 [employer].  

## 2013-05-07 NOTE — Assessment & Plan Note (Signed)
Continue routine prenatal care.  

## 2013-05-21 ENCOUNTER — Encounter: Payer: Self-pay | Admitting: Family Medicine

## 2013-05-21 ENCOUNTER — Ambulatory Visit (INDEPENDENT_AMBULATORY_CARE_PROVIDER_SITE_OTHER): Payer: PRIVATE HEALTH INSURANCE | Admitting: Family Medicine

## 2013-05-21 VITALS — BP 128/79 | Wt 147.0 lb

## 2013-05-21 DIAGNOSIS — Z3483 Encounter for supervision of other normal pregnancy, third trimester: Secondary | ICD-10-CM

## 2013-05-21 DIAGNOSIS — Z3480 Encounter for supervision of other normal pregnancy, unspecified trimester: Secondary | ICD-10-CM

## 2013-05-21 NOTE — Patient Instructions (Signed)
Third Trimester of Pregnancy The third trimester is from week 29 through week 42, months 7 through 9. The third trimester is a time when the fetus is growing rapidly. At the end of the ninth month, the fetus is about 20 inches in length and weighs 6 10 pounds.  BODY CHANGES Your body goes through many changes during pregnancy. The changes vary from woman to woman.   Your weight will continue to increase. You can expect to gain 25 35 pounds (11 16 kg) by the end of the pregnancy.  You may begin to get stretch marks on your hips, abdomen, and breasts.  You may urinate more often because the fetus is moving lower into your pelvis and pressing on your bladder.  You may develop or continue to have heartburn as a result of your pregnancy.  You may develop constipation because certain hormones are causing the muscles that push waste through your intestines to slow down.  You may develop hemorrhoids or swollen, bulging veins (varicose veins).  You may have pelvic pain because of the weight gain and pregnancy hormones relaxing your joints between the bones in your pelvis. Back aches may result from over exertion of the muscles supporting your posture.  Your breasts will continue to grow and be tender. A yellow discharge may leak from your breasts called colostrum.  Your belly button may stick out.  You may feel short of breath because of your expanding uterus.  You may notice the fetus "dropping," or moving lower in your abdomen.  You may have a bloody mucus discharge. This usually occurs a few days to a week before labor begins.  Your cervix becomes thin and soft (effaced) near your due date. WHAT TO EXPECT AT YOUR PRENATAL EXAMS  You will have prenatal exams every 2 weeks until week 36. Then, you will have weekly prenatal exams. During a routine prenatal visit:  You will be weighed to make sure you and the fetus are growing normally.  Your blood pressure is taken.  Your abdomen will  be measured to track your baby's growth.  The fetal heartbeat will be listened to.  Any test results from the previous visit will be discussed.  You may have a cervical check near your due date to see if you have effaced. At around 36 weeks, your caregiver will check your cervix. At the same time, your caregiver will also perform a test on the secretions of the vaginal tissue. This test is to determine if a type of bacteria, Group B streptococcus, is present. Your caregiver will explain this further. Your caregiver may ask you:  What your birth plan is.  How you are feeling.  If you are feeling the baby move.  If you have had any abnormal symptoms, such as leaking fluid, bleeding, severe headaches, or abdominal cramping.  If you have any questions. Other tests or screenings that may be performed during your third trimester include:  Blood tests that check for low iron levels (anemia).  Fetal testing to check the health, activity level, and growth of the fetus. Testing is done if you have certain medical conditions or if there are problems during the pregnancy. FALSE LABOR You may feel small, irregular contractions that eventually go away. These are called Braxton Hicks contractions, or false labor. Contractions may last for hours, days, or even weeks before true labor sets in. If contractions come at regular intervals, intensify, or become painful, it is best to be seen by your caregiver.    SIGNS OF LABOR   Menstrual-like cramps.  Contractions that are 5 minutes apart or less.  Contractions that start on the top of the uterus and spread down to the lower abdomen and back.  A sense of increased pelvic pressure or back pain.  A watery or bloody mucus discharge that comes from the vagina. If you have any of these signs before the 37th week of pregnancy, call your caregiver right away. You need to go to the hospital to get checked immediately. HOME CARE INSTRUCTIONS   Avoid all  smoking, herbs, alcohol, and unprescribed drugs. These chemicals affect the formation and growth of the baby.  Follow your caregiver's instructions regarding medicine use. There are medicines that are either safe or unsafe to take during pregnancy.  Exercise only as directed by your caregiver. Experiencing uterine cramps is a good sign to stop exercising.  Continue to eat regular, healthy meals.  Wear a good support bra for breast tenderness.  Do not use hot tubs, steam rooms, or saunas.  Wear your seat belt at all times when driving.  Avoid raw meat, uncooked cheese, cat litter boxes, and soil used by cats. These carry germs that can cause birth defects in the baby.  Take your prenatal vitamins.  Try taking a stool softener (if your caregiver approves) if you develop constipation. Eat more high-fiber foods, such as fresh vegetables or fruit and whole grains. Drink plenty of fluids to keep your urine clear or pale yellow.  Take warm sitz baths to soothe any pain or discomfort caused by hemorrhoids. Use hemorrhoid cream if your caregiver approves.  If you develop varicose veins, wear support hose. Elevate your feet for 15 minutes, 3 4 times a day. Limit salt in your diet.  Avoid heavy lifting, wear low heal shoes, and practice good posture.  Rest a lot with your legs elevated if you have leg cramps or low back pain.  Visit your dentist if you have not gone during your pregnancy. Use a soft toothbrush to brush your teeth and be gentle when you floss.  A sexual relationship may be continued unless your caregiver directs you otherwise.  Do not travel far distances unless it is absolutely necessary and only with the approval of your caregiver.  Take prenatal classes to understand, practice, and ask questions about the labor and delivery.  Make a trial run to the hospital.  Pack your hospital bag.  Prepare the baby's nursery.  Continue to go to all your prenatal visits as directed  by your caregiver. SEEK MEDICAL CARE IF:  You are unsure if you are in labor or if your water has broken.  You have dizziness.  You have mild pelvic cramps, pelvic pressure, or nagging pain in your abdominal area.  You have persistent nausea, vomiting, or diarrhea.  You have a bad smelling vaginal discharge.  You have pain with urination. SEEK IMMEDIATE MEDICAL CARE IF:   You have a fever.  You are leaking fluid from your vagina.  You have spotting or bleeding from your vagina.  You have severe abdominal cramping or pain.  You have rapid weight loss or gain.  You have shortness of breath with chest pain.  You notice sudden or extreme swelling of your face, hands, ankles, feet, or legs.  You have not felt your baby move in over an hour.  You have severe headaches that do not go away with medicine.  You have vision changes. Document Released: 06/21/2001 Document Revised: 02/27/2013 Document Reviewed:   08/28/2012 [employer] Patient Information 2014 [employer].  Breastfeeding Deciding to breastfeed is one of the best choices you can make for you and your baby. A change in hormones during pregnancy causes your breast tissue to grow and increases the number and size of your milk ducts. These hormones also allow proteins, sugars, and fats from your blood supply to make breast milk in your milk-producing glands. Hormones prevent breast milk from being released before your baby is born as well as prompt milk flow after birth. Once breastfeeding has begun, thoughts of your baby, as well as his or her sucking or crying, can stimulate the release of milk from your milk-producing glands.  BENEFITS OF BREASTFEEDING For Your Baby  Your first milk (colostrum) helps your baby's digestive system function better.   There are antibodies in your milk that help your baby fight off infections.   Your baby has a lower incidence of asthma, allergies, and sudden infant death syndrome.    The nutrients in breast milk are better for your baby than infant formulas and are designed uniquely for your baby's needs.   Breast milk improves your baby's brain development.   Your baby is less likely to develop other conditions, such as childhood obesity, asthma, or type 2 diabetes mellitus.  For You   Breastfeeding helps to create a very special bond between you and your baby.   Breastfeeding is convenient. Breast milk is always available at the correct temperature and costs nothing.   Breastfeeding helps to burn calories and helps you lose the weight gained during pregnancy.   Breastfeeding makes your uterus contract to its prepregnancy size faster and slows bleeding (lochia) after you give birth.   Breastfeeding helps to lower your risk of developing type 2 diabetes mellitus, osteoporosis, and breast or ovarian cancer later in life. SIGNS THAT YOUR BABY IS HUNGRY Early Signs of Hunger  Increased alertness or activity.  Stretching.  Movement of the head from side to side.  Movement of the head and opening of the mouth when the corner of the mouth or cheek is stroked (rooting).  Increased sucking sounds, smacking lips, cooing, sighing, or squeaking.  Hand-to-mouth movements.  Increased sucking of fingers or hands. Late Signs of Hunger  Fussing.  Intermittent crying. Extreme Signs of Hunger Signs of extreme hunger will require calming and consoling before your baby will be able to breastfeed successfully. Do not wait for the following signs of extreme hunger to occur before you initiate breastfeeding:   Restlessness.  A loud, strong cry.   Screaming. BREASTFEEDING BASICS Breastfeeding Initiation  Find a comfortable place to sit or lie down, with your neck and back well supported.  Place a pillow or rolled up blanket under your baby to bring him or her to the level of your breast (if you are seated). Nursing pillows are specially designed to help  support your arms and your baby while you breastfeed.  Make sure that your baby's abdomen is facing your abdomen.   Gently massage your breast. With your fingertips, massage from your chest wall toward your nipple in a circular motion. This encourages milk flow. You may need to continue this action during the feeding if your milk flows slowly.  Support your breast with 4 fingers underneath and your thumb above your nipple. Make sure your fingers are well away from your nipple and your baby's mouth.   Stroke your baby's lips gently with your finger or nipple.   When your baby's mouth is   open wide enough, quickly bring your baby to your breast, placing your entire nipple and as much of the colored area around your nipple (areola) as possible into your baby's mouth.   More areola should be visible above your baby's upper lip than below the lower lip.   Your baby's tongue should be between his or her lower gum and your breast.   Ensure that your baby's mouth is correctly positioned around your nipple (latched). Your baby's lips should create a seal on your breast and be turned out (everted).  It is common for your baby to suck about 2 3 minutes in order to start the flow of breast milk. Latching Teaching your baby how to latch on to your breast properly is very important. An improper latch can cause nipple pain and decreased milk supply for you and poor weight gain in your baby. Also, if your baby is not latched onto your nipple properly, he or she may swallow some air during feeding. This can make your baby fussy. Burping your baby when you switch breasts during the feeding can help to get rid of the air. However, teaching your baby to latch on properly is still the best way to prevent fussiness from swallowing air while breastfeeding. Signs that your baby has successfully latched on to your nipple:    Silent tugging or silent sucking, without causing you pain.   Swallowing heard  between every 3 4 sucks.    Muscle movement above and in front of his or her ears while sucking.  Signs that your baby has not successfully latched on to nipple:   Sucking sounds or smacking sounds from your baby while breastfeeding.  Nipple pain. If you think your baby has not latched on correctly, slip your finger into the corner of your baby's mouth to break the suction and place it between your baby's gums. Attempt breastfeeding initiation again. Signs of Successful Breastfeeding Signs from your baby:   A gradual decrease in the number of sucks or complete cessation of sucking.   Falling asleep.   Relaxation of his or her body.   Retention of a small amount of milk in his or her mouth.   Letting go of your breast by himself or herself. Signs from you:  Breasts that have increased in firmness, weight, and size 1 3 hours after feeding.   Breasts that are softer immediately after breastfeeding.  Increased milk volume, as well as a change in milk consistency and color by the 5th day of breastfeeding.   Nipples that are not sore, cracked, or bleeding. Signs That Your Baby is Getting Enough Milk  Wetting at least 3 diapers in a 24-hour period. The urine should be clear and pale yellow by age 5 days.  At least 3 stools in a 24-hour period by age 5 days. The stool should be soft and yellow.  At least 3 stools in a 24-hour period by age 7 days. The stool should be seedy and yellow.  No loss of weight greater than 10% of birth weight during the first 3 days of age.  Average weight gain of 4 7 ounces (120 210 mL) per week after age 4 days.  Consistent daily weight gain by age 5 days, without weight loss after the age of 2 weeks. After a feeding, your baby may spit up a small amount. This is common. BREASTFEEDING FREQUENCY AND DURATION Frequent feeding will help you make more milk and can prevent sore nipples and breast engorgement.   Breastfeed when you feel the need to  reduce the fullness of your breasts or when your baby shows signs of hunger. This is called "breastfeeding on demand." Avoid introducing a pacifier to your baby while you are working to establish breastfeeding (the first 4 6 weeks after your baby is born). After this time you may choose to use a pacifier. Research has shown that pacifier use during the first year of a baby's life decreases the risk of sudden infant death syndrome (SIDS). Allow your baby to feed on each breast as long as he or she wants. Breastfeed until your baby is finished feeding. When your baby unlatches or falls asleep while feeding from the first breast, offer the second breast. Because newborns are often sleepy in the first few weeks of life, you may need to awaken your baby to get him or her to feed. Breastfeeding times will vary from baby to baby. However, the following rules can serve as a guide to help you ensure that your baby is properly fed:  Newborns (babies 4 weeks of age or younger) may breastfeed every 1 3 hours.  Newborns should not go longer than 3 hours during the day or 5 hours during the night without breastfeeding.  You should breastfeed your baby a minimum of 8 times in a 24-hour period until you begin to introduce solid foods to your baby at around 6 months of age. BREAST MILK PUMPING Pumping and storing breast milk allows you to ensure that your baby is exclusively fed your breast milk, even at times when you are unable to breastfeed. This is especially important if you are going back to work while you are still breastfeeding or when you are not able to be present during feedings. Your lactation consultant can give you guidelines on how long it is safe to store breast milk.  A breast pump is a machine that allows you to pump milk from your breast into a sterile bottle. The pumped breast milk can then be stored in a refrigerator or freezer. Some breast pumps are operated by hand, while others use electricity. Ask  your lactation consultant which type will work best for you. Breast pumps can be purchased, but some hospitals and breastfeeding support groups lease breast pumps on a monthly basis. A lactation consultant can teach you how to hand express breast milk, if you prefer not to use a pump.  CARING FOR YOUR BREASTS WHILE YOU BREASTFEED Nipples can become dry, cracked, and sore while breastfeeding. The following recommendations can help keep your breasts moisturized and healthy:  Avoid using soap on your nipples.   Wear a supportive bra. Although not required, special nursing bras and tank tops are designed to allow access to your breasts for breastfeeding without taking off your entire bra or top. Avoid wearing underwire style bras or extremely tight bras.  Air dry your nipples for 3 4minutes after each feeding.   Use only cotton bra pads to absorb leaked breast milk. Leaking of breast milk between feedings is normal.   Use lanolin on your nipples after breastfeeding. Lanolin helps to maintain your skin's normal moisture barrier. If you use pure lanolin you do not need to wash it off before feeding your baby again. Pure lanolin is not toxic to your baby. You may also hand express a few drops of breast milk and gently massage that milk into your nipples and allow the milk to air dry. In the first few weeks after giving birth, some women   experience extremely full breasts (engorgement). Engorgement can make your breasts feel heavy, warm, and tender to the touch. Engorgement peaks within 3 5 days after you give birth. The following recommendations can help ease engorgement:  Completely empty your breasts while breastfeeding or pumping. You may want to start by applying warm, moist heat (in the shower or with warm water-soaked hand towels) just before feeding or pumping. This increases circulation and helps the milk flow. If your baby does not completely empty your breasts while breastfeeding, pump any extra  milk after he or she is finished.  Wear a snug bra (nursing or regular) or tank top for 1 2 days to signal your body to slightly decrease milk production.  Apply ice packs to your breasts, unless this is too uncomfortable for you.  Make sure that your baby is latched on and positioned properly while breastfeeding. If engorgement persists after 48 hours of following these recommendations, contact your health care provider or a lactation consultant. OVERALL HEALTH CARE RECOMMENDATIONS WHILE BREASTFEEDING  Eat healthy foods. Alternate between meals and snacks, eating 3 of each per day. Because what you eat affects your breast milk, some of the foods may make your baby more irritable than usual. Avoid eating these foods if you are sure that they are negatively affecting your baby.  Drink milk, fruit juice, and water to satisfy your thirst (about 10 glasses a day).   Rest often, relax, and continue to take your prenatal vitamins to prevent fatigue, stress, and anemia.  Continue breast self-awareness checks.  Avoid chewing and smoking tobacco.  Avoid alcohol and drug use. Some medicines that may be harmful to your baby can pass through breast milk. It is important to ask your health care provider before taking any medicine, including all over-the-counter and prescription medicine as well as vitamin and herbal supplements. It is possible to become pregnant while breastfeeding. If birth control is desired, ask your health care provider about options that will be safe for your baby. SEEK MEDICAL CARE IF:   You feel like you want to stop breastfeeding or have become frustrated with breastfeeding.  You have painful breasts or nipples.  Your nipples are cracked or bleeding.  Your breasts are red, tender, or warm.  You have a swollen area on either breast.  You have a fever or chills.  You have nausea or vomiting.  You have drainage other than breast milk from your nipples.  Your breasts  do not become full before feedings by the 5th day after you give birth.  You feel sad and depressed.  Your baby is too sleepy to eat well.  Your baby is having trouble sleeping.   Your baby is wetting less than 3 diapers in a 24-hour period.  Your baby has less than 3 stools in a 24-hour period.  Your baby's skin or the white part of his or her eyes becomes yellow.   Your baby is not gaining weight by 5 days of age. SEEK IMMEDIATE MEDICAL CARE IF:   Your baby is overly tired (lethargic) and does not want to wake up and feed.  Your baby develops an unexplained fever. Document Released: 06/27/2005 Document Revised: 02/27/2013 Document Reviewed: 12/19/2012 [employer] Patient Information 2014 [employer].  

## 2013-05-21 NOTE — Progress Notes (Signed)
P=93 

## 2013-05-21 NOTE — Assessment & Plan Note (Signed)
Continue routine prenatal care.  

## 2013-05-21 NOTE — Progress Notes (Signed)
Doing well--having some lower abdominal cramping--PTL precautions reviewed.

## 2013-06-04 ENCOUNTER — Ambulatory Visit (INDEPENDENT_AMBULATORY_CARE_PROVIDER_SITE_OTHER): Payer: PRIVATE HEALTH INSURANCE | Admitting: Obstetrics & Gynecology

## 2013-06-04 VITALS — BP 133/86 | Wt 149.0 lb

## 2013-06-04 DIAGNOSIS — Z3483 Encounter for supervision of other normal pregnancy, third trimester: Secondary | ICD-10-CM

## 2013-06-04 DIAGNOSIS — Z3480 Encounter for supervision of other normal pregnancy, unspecified trimester: Secondary | ICD-10-CM

## 2013-06-04 NOTE — Progress Notes (Signed)
Pelvic cultures next visit.  No other complaints or concerns.  Fetal movement and labor precautions reviewed.

## 2013-06-04 NOTE — Patient Instructions (Signed)
Return to clinic for any scheduled appointments or for any gynecologic concerns as needed.   

## 2013-06-04 NOTE — Progress Notes (Signed)
P = [phone] 

## 2013-06-11 ENCOUNTER — Ambulatory Visit (INDEPENDENT_AMBULATORY_CARE_PROVIDER_SITE_OTHER): Payer: BC Managed Care – PPO | Admitting: Family Medicine

## 2013-06-11 VITALS — BP 125/79 | Wt 150.0 lb

## 2013-06-11 DIAGNOSIS — Z3480 Encounter for supervision of other normal pregnancy, unspecified trimester: Secondary | ICD-10-CM

## 2013-06-11 DIAGNOSIS — Z3483 Encounter for supervision of other normal pregnancy, third trimester: Secondary | ICD-10-CM

## 2013-06-11 NOTE — Progress Notes (Signed)
[id number] 

## 2013-06-11 NOTE — Patient Instructions (Signed)
Breastfeeding Deciding to breastfeed is one of the best choices you can make for you and your baby. A change in hormones during pregnancy causes your breast tissue to grow and increases the number and size of your milk ducts. These hormones also allow proteins, sugars, and fats from your blood supply to make breast milk in your milk-producing glands. Hormones prevent breast milk from being released before your baby is born as well as prompt milk flow after birth. Once breastfeeding has begun, thoughts of your baby, as well as his or her sucking or crying, can stimulate the release of milk from your milk-producing glands.  BENEFITS OF BREASTFEEDING For Your Baby  Your first milk (colostrum) helps your baby's digestive system function better.   There are antibodies in your milk that help your baby fight off infections.   Your baby has a lower incidence of asthma, allergies, and sudden infant death syndrome.   The nutrients in breast milk are better for your baby than infant formulas and are designed uniquely for your baby's needs.   Breast milk improves your baby's brain development.   Your baby is less likely to develop other conditions, such as childhood obesity, asthma, or type 2 diabetes mellitus.  For You   Breastfeeding helps to create a very special bond between you and your baby.   Breastfeeding is convenient. Breast milk is always available at the correct temperature and costs nothing.   Breastfeeding helps to burn calories and helps you lose the weight gained during pregnancy.   Breastfeeding makes your uterus contract to its prepregnancy size faster and slows bleeding (lochia) after you give birth.   Breastfeeding helps to lower your risk of developing type 2 diabetes mellitus, osteoporosis, and breast or ovarian cancer later in life. SIGNS THAT YOUR BABY IS HUNGRY Early Signs of Hunger  Increased alertness or activity.  Stretching.  Movement of the head from  side to side.  Movement of the head and opening of the mouth when the corner of the mouth or cheek is stroked (rooting).  Increased sucking sounds, smacking lips, cooing, sighing, or squeaking.  Hand-to-mouth movements.  Increased sucking of fingers or hands. Late Signs of Hunger  Fussing.  Intermittent crying. Extreme Signs of Hunger Signs of extreme hunger will require calming and consoling before your baby will be able to breastfeed successfully. Do not wait for the following signs of extreme hunger to occur before you initiate breastfeeding:   Restlessness.  A loud, strong cry.   Screaming. BREASTFEEDING BASICS Breastfeeding Initiation  Find a comfortable place to sit or lie down, with your neck and back well supported.  Place a pillow or rolled up blanket under your baby to bring him or her to the level of your breast (if you are seated). Nursing pillows are specially designed to help support your arms and your baby while you breastfeed.  Make sure that your baby's abdomen is facing your abdomen.   Gently massage your breast. With your fingertips, massage from your chest wall toward your nipple in a circular motion. This encourages milk flow. You may need to continue this action during the feeding if your milk flows slowly.  Support your breast with 4 fingers underneath and your thumb above your nipple. Make sure your fingers are well away from your nipple and your baby's mouth.   Stroke your baby's lips gently with your finger or nipple.   When your baby's mouth is open wide enough, quickly bring your baby to your   breast, placing your entire nipple and as much of the colored area around your nipple (areola) as possible into your baby's mouth.   More areola should be visible above your baby's upper lip than below the lower lip.   Your baby's tongue should be between his or her lower gum and your breast.   Ensure that your baby's mouth is correctly positioned  around your nipple (latched). Your baby's lips should create a seal on your breast and be turned out (everted).  It is common for your baby to suck about 2 3 minutes in order to start the flow of breast milk. Latching Teaching your baby how to latch on to your breast properly is very important. An improper latch can cause nipple pain and decreased milk supply for you and poor weight gain in your baby. Also, if your baby is not latched onto your nipple properly, he or she may swallow some air during feeding. This can make your baby fussy. Burping your baby when you switch breasts during the feeding can help to get rid of the air. However, teaching your baby to latch on properly is still the best way to prevent fussiness from swallowing air while breastfeeding. Signs that your baby has successfully latched on to your nipple:    Silent tugging or silent sucking, without causing you pain.   Swallowing heard between every 3 4 sucks.    Muscle movement above and in front of his or her ears while sucking.  Signs that your baby has not successfully latched on to nipple:   Sucking sounds or smacking sounds from your baby while breastfeeding.  Nipple pain. If you think your baby has not latched on correctly, slip your finger into the corner of your baby's mouth to break the suction and place it between your baby's gums. Attempt breastfeeding initiation again. Signs of Successful Breastfeeding Signs from your baby:   A gradual decrease in the number of sucks or complete cessation of sucking.   Falling asleep.   Relaxation of his or her body.   Retention of a small amount of milk in his or her mouth.   Letting go of your breast by himself or herself. Signs from you:  Breasts that have increased in firmness, weight, and size 1 3 hours after feeding.   Breasts that are softer immediately after breastfeeding.  Increased milk volume, as well as a change in milk consistency and color by  the 5th day of breastfeeding.   Nipples that are not sore, cracked, or bleeding. Signs That Your Baby is Getting Enough Milk  Wetting at least 3 diapers in a 24-hour period. The urine should be clear and pale yellow by age 5 days.  At least 3 stools in a 24-hour period by age 5 days. The stool should be soft and yellow.  At least 3 stools in a 24-hour period by age 7 days. The stool should be seedy and yellow.  No loss of weight greater than 10% of birth weight during the first 3 days of age.  Average weight gain of 4 7 ounces (120 210 mL) per week after age 4 days.  Consistent daily weight gain by age 5 days, without weight loss after the age of 2 weeks. After a feeding, your baby may spit up a small amount. This is common. BREASTFEEDING FREQUENCY AND DURATION Frequent feeding will help you make more milk and can prevent sore nipples and breast engorgement. Breastfeed when you feel the need to reduce   the fullness of your breasts or when your baby shows signs of hunger. This is called "breastfeeding on demand." Avoid introducing a pacifier to your baby while you are working to establish breastfeeding (the first 4 6 weeks after your baby is born). After this time you may choose to use a pacifier. Research has shown that pacifier use during the first year of a baby's life decreases the risk of sudden infant death syndrome (SIDS). Allow your baby to feed on each breast as long as he or she wants. Breastfeed until your baby is finished feeding. When your baby unlatches or falls asleep while feeding from the first breast, offer the second breast. Because newborns are often sleepy in the first few weeks of life, you may need to awaken your baby to get him or her to feed. Breastfeeding times will vary from baby to baby. However, the following rules can serve as a guide to help you ensure that your baby is properly fed:  Newborns (babies 4 weeks of age or younger) may breastfeed every 1 3  hours.  Newborns should not go longer than 3 hours during the day or 5 hours during the night without breastfeeding.  You should breastfeed your baby a minimum of 8 times in a 24-hour period until you begin to introduce solid foods to your baby at around 6 months of age. BREAST MILK PUMPING Pumping and storing breast milk allows you to ensure that your baby is exclusively fed your breast milk, even at times when you are unable to breastfeed. This is especially important if you are going back to work while you are still breastfeeding or when you are not able to be present during feedings. Your lactation consultant can give you guidelines on how long it is safe to store breast milk.  A breast pump is a machine that allows you to pump milk from your breast into a sterile bottle. The pumped breast milk can then be stored in a refrigerator or freezer. Some breast pumps are operated by hand, while others use electricity. Ask your lactation consultant which type will work best for you. Breast pumps can be purchased, but some hospitals and breastfeeding support groups lease breast pumps on a monthly basis. A lactation consultant can teach you how to hand express breast milk, if you prefer not to use a pump.  CARING FOR YOUR BREASTS WHILE YOU BREASTFEED Nipples can become dry, cracked, and sore while breastfeeding. The following recommendations can help keep your breasts moisturized and healthy:  Avoid using soap on your nipples.   Wear a supportive bra. Although not required, special nursing bras and tank tops are designed to allow access to your breasts for breastfeeding without taking off your entire bra or top. Avoid wearing underwire style bras or extremely tight bras.  Air dry your nipples for 3 4minutes after each feeding.   Use only cotton bra pads to absorb leaked breast milk. Leaking of breast milk between feedings is normal.   Use lanolin on your nipples after breastfeeding. Lanolin helps to  maintain your skin's normal moisture barrier. If you use pure lanolin you do not need to wash it off before feeding your baby again. Pure lanolin is not toxic to your baby. You may also hand express a few drops of breast milk and gently massage that milk into your nipples and allow the milk to air dry. In the first few weeks after giving birth, some women experience extremely full breasts (engorgement). Engorgement can make   your breasts feel heavy, warm, and tender to the touch. Engorgement peaks within 3 5 days after you give birth. The following recommendations can help ease engorgement:  Completely empty your breasts while breastfeeding or pumping. You may want to start by applying warm, moist heat (in the shower or with warm water-soaked hand towels) just before feeding or pumping. This increases circulation and helps the milk flow. If your baby does not completely empty your breasts while breastfeeding, pump any extra milk after he or she is finished.  Wear a snug bra (nursing or regular) or tank top for 1 2 days to signal your body to slightly decrease milk production.  Apply ice packs to your breasts, unless this is too uncomfortable for you.  Make sure that your baby is latched on and positioned properly while breastfeeding. If engorgement persists after 48 hours of following these recommendations, contact your health care provider or a lactation consultant. OVERALL HEALTH CARE RECOMMENDATIONS WHILE BREASTFEEDING  Eat healthy foods. Alternate between meals and snacks, eating 3 of each per day. Because what you eat affects your breast milk, some of the foods may make your baby more irritable than usual. Avoid eating these foods if you are sure that they are negatively affecting your baby.  Drink milk, fruit juice, and water to satisfy your thirst (about 10 glasses a day).   Rest often, relax, and continue to take your prenatal vitamins to prevent fatigue, stress, and anemia.  Continue  breast self-awareness checks.  Avoid chewing and smoking tobacco.  Avoid alcohol and drug use. Some medicines that may be harmful to your baby can pass through breast milk. It is important to ask your health care provider before taking any medicine, including all over-the-counter and prescription medicine as well as vitamin and herbal supplements. It is possible to become pregnant while breastfeeding. If birth control is desired, ask your health care provider about options that will be safe for your baby. SEEK MEDICAL CARE IF:   You feel like you want to stop breastfeeding or have become frustrated with breastfeeding.  You have painful breasts or nipples.  Your nipples are cracked or bleeding.  Your breasts are red, tender, or warm.  You have a swollen area on either breast.  You have a fever or chills.  You have nausea or vomiting.  You have drainage other than breast milk from your nipples.  Your breasts do not become full before feedings by the 5th day after you give birth.  You feel sad and depressed.  Your baby is too sleepy to eat well.  Your baby is having trouble sleeping.   Your baby is wetting less than 3 diapers in a 24-hour period.  Your baby has less than 3 stools in a 24-hour period.  Your baby's skin or the white part of his or her eyes becomes yellow.   Your baby is not gaining weight by 5 days of age. SEEK IMMEDIATE MEDICAL CARE IF:   Your baby is overly tired (lethargic) and does not want to wake up and feed.  Your baby develops an unexplained fever. Document Released: 06/27/2005 Document Revised: 02/27/2013 Document Reviewed: 12/19/2012 Exit[employer] Patient Information 2014 [employer].  

## 2013-06-11 NOTE — Progress Notes (Signed)
Cultures today 

## 2013-06-14 ENCOUNTER — Encounter: Payer: Self-pay | Admitting: Family Medicine

## 2013-06-14 LAB — CULTURE, BETA STREP (GROUP B ONLY)

## 2013-06-18 ENCOUNTER — Ambulatory Visit (INDEPENDENT_AMBULATORY_CARE_PROVIDER_SITE_OTHER): Payer: PRIVATE HEALTH INSURANCE | Admitting: Obstetrics & Gynecology

## 2013-06-18 ENCOUNTER — Encounter: Payer: Self-pay | Admitting: Obstetrics & Gynecology

## 2013-06-18 VITALS — BP 125/80 | Wt 149.0 lb

## 2013-06-18 DIAGNOSIS — Z3483 Encounter for supervision of other normal pregnancy, third trimester: Secondary | ICD-10-CM

## 2013-06-18 DIAGNOSIS — Z3480 Encounter for supervision of other normal pregnancy, unspecified trimester: Secondary | ICD-10-CM

## 2013-06-18 NOTE — Progress Notes (Signed)
Routine visit. Good FM. No OB problems, thinks baby dropped. Labor precautions reviewed. She requests a cervical exam.

## 2013-06-18 NOTE — Progress Notes (Signed)
P-[id number] 

## 2013-06-25 ENCOUNTER — Inpatient Hospital Stay (HOSPITAL_COMMUNITY)
Admission: AD | Admit: 2013-06-25 | Discharge: 2013-06-27 | DRG: 775 | Disposition: A | Discharge status: Home / Self Care | Payer: 59 | Source: Ambulatory Visit | Attending: Obstetrics & Gynecology | Admitting: Obstetrics & Gynecology

## 2013-06-25 ENCOUNTER — Encounter (HOSPITAL_COMMUNITY): Payer: Self-pay | Admitting: *Deleted

## 2013-06-25 ENCOUNTER — Ambulatory Visit (INDEPENDENT_AMBULATORY_CARE_PROVIDER_SITE_OTHER): Payer: PRIVATE HEALTH INSURANCE | Admitting: Obstetrics & Gynecology

## 2013-06-25 VITALS — BP 129/79 | Wt 148.0 lb

## 2013-06-25 DIAGNOSIS — Z3483 Encounter for supervision of other normal pregnancy, third trimester: Secondary | ICD-10-CM

## 2013-06-25 DIAGNOSIS — M4120 Other idiopathic scoliosis, site unspecified: Secondary | ICD-10-CM

## 2013-06-25 DIAGNOSIS — O99892 Other specified diseases and conditions complicating childbirth: Secondary | ICD-10-CM | POA: Diagnosis present

## 2013-06-25 DIAGNOSIS — IMO0001 Reserved for inherently not codable concepts without codable children: Secondary | ICD-10-CM

## 2013-06-25 DIAGNOSIS — Z3480 Encounter for supervision of other normal pregnancy, unspecified trimester: Secondary | ICD-10-CM

## 2013-06-25 DIAGNOSIS — O9989 Other specified diseases and conditions complicating pregnancy, childbirth and the puerperium: Secondary | ICD-10-CM

## 2013-06-25 LAB — CBC
HCT: 36.2 % (ref 36.0–46.0)
MCHC: 35.4 g/dL (ref 30.0–36.0)
MCV: 88.3 fL (ref 78.0–100.0)
Platelets: 175 10*3/uL (ref 150–400)
RDW: 13.6 % (ref 11.5–15.5)

## 2013-06-25 MED ORDER — ACETAMINOPHEN 325 MG PO TABS: 650.0000 mg | ORAL_TABLET | ORAL | Status: DC | PRN

## 2013-06-25 MED ORDER — ONDANSETRON HCL 4 MG/2ML IJ SOLN: 4.0000 mg | Freq: Four times a day (QID) | INTRAMUSCULAR | Status: DC | PRN

## 2013-06-25 MED ORDER — LACTATED RINGERS IV SOLN: INTRAVENOUS | Status: DC

## 2013-06-25 MED ORDER — LIDOCAINE HCL (PF) 1 % IJ SOLN
30.0000 mL | INTRAMUSCULAR | Status: DC | PRN
  Filled 2013-06-25: qty 30

## 2013-06-25 MED ORDER — OXYCODONE-ACETAMINOPHEN 5-325 MG PO TABS: 1.0000 | ORAL_TABLET | ORAL | Status: DC | PRN

## 2013-06-25 MED ORDER — NALBUPHINE SYRINGE 5 MG/0.5 ML
5.0000 mg | INJECTION | INTRAMUSCULAR | Status: DC | PRN
  Filled 2013-06-25: qty 1

## 2013-06-25 MED ORDER — OXYTOCIN BOLUS FROM INFUSION
500.0000 mL | INTRAVENOUS | Status: DC
  Administered 2013-06-25: 500 mL via INTRAVENOUS

## 2013-06-25 MED ORDER — IBUPROFEN 600 MG PO TABS
600.0000 mg | ORAL_TABLET | Freq: Four times a day (QID) | ORAL | Status: DC | PRN
  Administered 2013-06-25 – 2013-06-26 (×2): 600 mg via ORAL
  Filled 2013-06-25: qty 1

## 2013-06-25 MED ORDER — CITRIC ACID-SODIUM CITRATE 334-500 MG/5ML PO SOLN: 30.0000 mL | ORAL | Status: DC | PRN

## 2013-06-25 MED ORDER — OXYTOCIN 40 UNITS IN LACTATED RINGERS INFUSION - SIMPLE MED
INTRAVENOUS | Status: AC
  Filled 2013-06-25: qty 1000

## 2013-06-25 MED ORDER — LIDOCAINE HCL (PF) 1 % IJ SOLN
INTRAMUSCULAR | Status: AC
  Filled 2013-06-25: qty 30

## 2013-06-25 MED ORDER — LACTATED RINGERS IV SOLN: 500.0000 mL | INTRAVENOUS | Status: DC | PRN

## 2013-06-25 MED ORDER — OXYTOCIN 40 UNITS IN LACTATED RINGERS INFUSION - SIMPLE MED
62.5000 mL/h | INTRAVENOUS | Status: DC
  Administered 2013-06-25: 62.5 mL/h via INTRAVENOUS

## 2013-06-25 NOTE — Progress Notes (Signed)
No complaints or concerns.  Fetal movement and labor precautions reviewed. 

## 2013-06-25 NOTE — Progress Notes (Signed)
P = 92 

## 2013-06-25 NOTE — Progress Notes (Signed)
Dr Prior notified of pt's complaints of contractions and her history of fast labors and VE on arrival

## 2013-06-25 NOTE — Patient Instructions (Signed)
Return to clinic for any obstetric concerns or go to [facility] for evaluation  

## 2013-06-25 NOTE — Progress Notes (Signed)
Dr Prior notified of pt's VE, contraction pattern and FHR, orders received to observe pt for another hour to an hour and a half and recheck cervix

## 2013-06-25 NOTE — MAU Note (Signed)
PT SAYS SHE IS HAVING UC'S.  OTHER DEL WAS 1-2 HRS.   VE IN OFFICE TODAY- 4 CM.    DENIES HSV AND MRSA.

## 2013-06-26 ENCOUNTER — Encounter (HOSPITAL_COMMUNITY): Payer: Self-pay | Admitting: *Deleted

## 2013-06-26 LAB — RUBELLA SCREEN: Rubella: 4.54 Index — ABNORMAL HIGH (ref <0.90)

## 2013-06-26 LAB — CBC
HCT: 34.9 % — ABNORMAL LOW (ref 36.0–46.0)
Hemoglobin: 12.2 g/dL (ref 12.0–15.0)
MCHC: 35 g/dL (ref 30.0–36.0)
MCV: 89.3 fL (ref 78.0–100.0)
WBC: 10.7 10*3/uL — ABNORMAL HIGH (ref 4.0–10.5)

## 2013-06-26 MED ORDER — PRENATAL MULTIVITAMIN CH
1.0000 | ORAL_TABLET | Freq: Every day | ORAL | Status: DC
  Administered 2013-06-26 – 2013-06-27 (×2): 1 via ORAL
  Filled 2013-06-26 (×2): qty 1

## 2013-06-26 MED ORDER — TETANUS-DIPHTH-ACELL PERTUSSIS 5-2.5-18.5 LF-MCG/0.5 IM SUSP
0.5000 mL | Freq: Once | INTRAMUSCULAR | Status: AC
  Administered 2013-06-26: 0.5 mL via INTRAMUSCULAR
  Filled 2013-06-26: qty 0.5

## 2013-06-26 MED ORDER — ONDANSETRON HCL 4 MG/2ML IJ SOLN: 4.0000 mg | INTRAMUSCULAR | Status: DC | PRN

## 2013-06-26 MED ORDER — ZOLPIDEM TARTRATE 5 MG PO TABS: 5.0000 mg | ORAL_TABLET | Freq: Every evening | ORAL | Status: DC | PRN

## 2013-06-26 MED ORDER — ONDANSETRON HCL 4 MG PO TABS: 4.0000 mg | ORAL_TABLET | ORAL | Status: DC | PRN

## 2013-06-26 MED ORDER — DIPHENHYDRAMINE HCL 25 MG PO CAPS: 25.0000 mg | ORAL_CAPSULE | Freq: Four times a day (QID) | ORAL | Status: DC | PRN

## 2013-06-26 MED ORDER — WITCH HAZEL-GLYCERIN EX PADS: 1.0000 "application " | MEDICATED_PAD | CUTANEOUS | Status: DC | PRN

## 2013-06-26 MED ORDER — SENNOSIDES-DOCUSATE SODIUM 8.6-50 MG PO TABS
2.0000 | ORAL_TABLET | ORAL | Status: DC
  Administered 2013-06-26: 2 via ORAL
  Filled 2013-06-26 (×2): qty 2

## 2013-06-26 MED ORDER — DIBUCAINE 1 % RE OINT: 1.0000 "application " | TOPICAL_OINTMENT | RECTAL | Status: DC | PRN

## 2013-06-26 MED ORDER — LANOLIN HYDROUS EX OINT: TOPICAL_OINTMENT | CUTANEOUS | Status: DC | PRN

## 2013-06-26 MED ORDER — SIMETHICONE 80 MG PO CHEW: 80.0000 mg | CHEWABLE_TABLET | ORAL | Status: DC | PRN

## 2013-06-26 MED ORDER — FENTANYL CITRATE 0.05 MG/ML IJ SOLN: 100.0000 ug | INTRAMUSCULAR | Status: DC | PRN

## 2013-06-26 MED ORDER — BENZOCAINE-MENTHOL 20-0.5 % EX AERO: 1.0000 "application " | INHALATION_SPRAY | CUTANEOUS | Status: DC | PRN

## 2013-06-26 MED ORDER — IBUPROFEN 600 MG PO TABS
600.0000 mg | ORAL_TABLET | Freq: Four times a day (QID) | ORAL | Status: DC
  Administered 2013-06-26 – 2013-06-27 (×5): 600 mg via ORAL
  Filled 2013-06-26 (×6): qty 1

## 2013-06-26 MED ORDER — OXYCODONE-ACETAMINOPHEN 5-325 MG PO TABS: 1.0000 | ORAL_TABLET | ORAL | Status: DC | PRN

## 2013-06-26 MED ORDER — HYDROCORTISONE 1 % EX OINT
TOPICAL_OINTMENT | Freq: Two times a day (BID) | CUTANEOUS | Status: DC
  Administered 2013-06-26: 1 via TOPICAL
  Filled 2013-06-26: qty 28.35

## 2013-06-26 NOTE — Progress Notes (Signed)
Post Partum Day 1 Subjective: no complaints, up ad lib, voiding, tolerating PO and + flatus  Objective: Blood pressure 104/60, pulse 62, temperature 98.1 F (36.7 C), temperature source Oral, resp. rate 18, height 5\' 4"  (1.626 m), weight 67.132 kg (148 lb), last menstrual period 09/29/2012, SpO2 99.00%, unknown if currently breastfeeding.  Physical Exam:  General: alert, cooperative, appears stated age and no distress Lochia: appropriate Uterine Fundus: firm Incision: NA DVT Evaluation: Negative Homan's sign.   Recent Labs  06/25/13 2200 06/26/13 0630  HGB 12.8 12.2  HCT 36.2 34.9*    Assessment/Plan: Plan for discharge tomorrow, Breastfeeding and Contraception vasectomy   LOS: 1 day   Allison Hancock 06/26/2013, 7:23 AM

## 2013-06-26 NOTE — Lactation Note (Signed)
This note was copied from the chart of Allison Hancock. Lactation Consultation Note  Patient Name: Allison Hancock [id number] Date: 06/26/2013 Reason for consult: Initial assessment  Mom c/o nipple pain with feedings.  Infant is 50 hrs old and has breastfed x5 (15-45 min); voids - 3; stools -2.  Mom has experience breastfeeding previous 2 children for 5-6 weeks and c/o extreme pain throughout the 5-[redacted] weeks along with mastitis and then she went back to work which caused a decrease in milk supply.  Mom stated she will be staying home with this infant and wants the breastfeeding experience to last longer.  Upon assessment, mom's nipples and areola's (entire areola) appears very red.  Mom described the pain as a "raw, needle-like pain with pinching" pain during the feedings.  LC observed mom latch infant on right side using cross-cradle hold with a shallow latch on bottom lip; taught mom asymetrical latching leading with bottom lip to assure depth and then rolling head up over the nipple.  Mom demonstrated latch technique and said the pinching pain was better but she was still feeling the "raw" and "needle-like" pain with each compression of the baby's mouth.  Upon coming off nipple slight compression stripe noted on nipple.  Discussed S&S of fungal infections.  Encouraged patient to discuss with OB provider if pain persist with proper latching assuring depth.  Infant was still feeding when LC left room.  Lactation brochure given and informed of hospital/ community support services and outpatient LC services.  Encouraged to call for assistance if needed.    Spoke with Dr. Jarvis Newcomer via phone about mom's symptoms and recommended treating with both antifungal and antibiotic given mom's previous history and challenges with unresolved mastitis that could have been d/t untreated fungal infection thereby causing persistent pain for the 5-6 weeks of breastfeeding.    Maternal Data Formula Feeding for  Exclusion: No Infant to breast within first hour of birth: Yes Has patient been taught Hand Expression?: Yes Does the patient have breastfeeding experience prior to this delivery?: Yes  Feeding Feeding Type: Breast Fed  LATCH Score/Interventions Latch: Grasps breast easily, tongue down, lips flanged, rhythmical sucking.  Audible Swallowing: A few with stimulation Intervention(s): Skin to skin  Type of Nipple: Everted at rest and after stimulation  Comfort (Breast/Nipple): Filling, red/small blisters or bruises, mild/mod discomfort  Problem noted: Mild/Moderate discomfort Interventions (Mild/moderate discomfort): Hand expression  Hold (Positioning): No assistance needed to correctly position infant at breast.  LATCH Score: 8  Lactation Tools Discussed/Used WIC Program: No   Consult Status Consult Status: Follow-up Date: 06/27/13 Follow-up type: In-patient    Lendon Ka 06/26/2013, 2:49 PM

## 2013-06-26 NOTE — H&P (Signed)
Modesty Rudy is a 32 y.o. female [phone] with IUP at [redacted]w[redacted]d by LMP confirmed by 10w [place] presenting for SOOL. Pt states she has been having regular, painful every 2-3 minutes contractions, associated with none vaginal bleeding.  Membranes are intact, with active fetal movement.   [facility] 21 wks; transfer of care after move from [place], [place]. Denies fever/chills, headache, vision changes, epigastric pain, chest pain/shortness of breath, flank pain, swelling/erythema/tenderness in legs. Prenatal History/Complications:   Clinic  [facility]  Dating LMP        Ultrasound consistent with LMP: Yes  Genetic Screen Declined  Anatomic [place] Inadequate views of heart and face at 26 weeks, patient declines rescan  GTT Third trimester: 101  TDaP vaccine Declined  Flu vaccine Declined  GBS neg  Baby Food  Breast  Contraception  Vasectomy  Pediatrician  [facility]   Past Medical History: Past Medical History  Diagnosis Date  . Scoliosis     Past Surgical History: Past Surgical History  Procedure Laterality Date  . Spinal fusion      two surgeries    Obstetrical History: OB History   Grav Para Term Preterm Abortions TAB SAB Ect Mult Living   3 3 3  0 0 0 0 0 0 3      Social History: History   Social History  . Marital Status: Married    Spouse Name: N/A    Number of Children: N/A  . Years of Education: N/A   Social History Main Topics  . Smoking status: Never Smoker   . Smokeless tobacco: Never Used  . Alcohol Use: No  . Drug Use: No  . Sexual Activity: Yes   Other Topics Concern  . None   Social History Narrative  . None    Family History: Family History  Problem Relation Age of Onset  . Diabetes Maternal Grandfather     Allergies: No Known Allergies  Prescriptions prior to admission  Medication Sig Dispense Refill  . Prenatal Vit-Fe Fumarate-FA (NATACHEW PO) Take 2 tablets by mouth daily.         Review of Systems  All  systems reviewed and negative except as stated in HPI  Physical Exam Blood pressure 120/71, pulse 77, temperature 98.1 F (36.7 C), temperature source Oral, resp. rate 18, height 5\' 4"  (1.626 m), weight 67.132 kg (148 lb), last menstrual period 09/29/2012, unknown if currently breastfeeding. General appearance: alert, cooperative and moderate distress Lungs: clear to auscultation bilaterally Heart: regular rate and rhythm Abdomen: soft, non-tender; bowel sounds normal Extremities: Homans sign is negative, no sign of DVT Presentation: cephalic Fetal monitoringBaseline: 130 bpm, Variability: Good {> 6 bpm), Accelerations: Reactive and Decelerations: Absent Uterine activityFrequency: Every 1-2 minutes, Duration: 30-45 seconds and Intensity: strong Dilation: 10 Effacement (%): 100 Station: +2 Exam by:: Dr. Prior   Prenatal labs: ABO, Rh: --/--/A POS (12/16 2200) Antibody:  Neg Rubella:  Immune RPR: NON REAC (09/30 1010)  HBsAg:   Neg HIV: NON REACTIVE (09/30 1010)  GBS:   Neg  Results for orders placed during the hospital encounter of 06/25/13 (from the past 24 hour(s))  CBC     Status: None   Collection Time    06/25/13 10:00 PM      Result Value Range   WBC 8.6  4.0 - 10.5 K/uL   RBC 4.10  3.87 - 5.11 MIL/uL   Hemoglobin 12.8  12.0 - 15.0 g/dL   HCT 45.4  09.8 - 11.9 %  MCV 88.3  78.0 - 100.0 fL   MCH 31.2  26.0 - 34.0 pg   MCHC 35.4  30.0 - 36.0 g/dL   RDW 16.1  09.6 - 04.5 %   Platelets 175  150 - 400 K/uL  ABO/RH     Status: None   Collection Time    06/25/13 10:00 PM      Result Value Range   ABO/RH(D) A POS     Assessment: Man Bonneau is a 32 y.o. [phone] with an IUP at [redacted]w[redacted]d presenting for SOOL  Plan: Admit to L&D #Labor: active labor, progressing normally #Pain: management without medications #FWB: Cat I #ID: GBS neg, Rubella immune, declined Flu & TDaP  Pior, Jearld Lesch, MD 06/26/2013, 1:01 AM  I was present for the exam and agree with  above.  Dorathy Kinsman, CNM 06/26/2013 2:25 AM

## 2013-06-27 MED ORDER — BETAMETHASONE VALERATE 0.1 % EX OINT: TOPICAL_OINTMENT | CUTANEOUS | Status: DC

## 2013-06-27 MED ORDER — IBUPROFEN 600 MG PO TABS: 600.0000 mg | ORAL_TABLET | Freq: Four times a day (QID) | ORAL | Status: DC | PRN

## 2013-06-27 MED ORDER — MUPIROCIN 2 % EX OINT: TOPICAL_OINTMENT | CUTANEOUS | Status: DC

## 2013-06-27 MED ORDER — MICONAZOLE NITRATE 2 % POWD: Status: DC

## 2013-06-27 MED ORDER — HYDROCORTISONE 1 % EX OINT: TOPICAL_OINTMENT | Freq: Two times a day (BID) | CUTANEOUS | Status: DC

## 2013-06-27 NOTE — H&P (Signed)
Attestation of Attending Supervision of Advanced Practitioner (CNM/NP): Evaluation and management procedures were performed by the Advanced Practitioner under my supervision and collaboration.  I have reviewed the Advanced Practitioner's note and chart, and I agree with the management and plan.  HARRAWAY-SMITH, Chavez Rosol 3:36 PM     

## 2013-06-27 NOTE — Discharge Summary (Signed)
Obstetric Discharge Summary Reason for Admission: onset of labor Prenatal Procedures: none Intrapartum Procedures: spontaneous vaginal delivery Postpartum Procedures: none Complications-Operative and Postpartum: none Hemoglobin  Date Value Range Status  06/26/2013 12.2  12.0 - 15.0 g/dL Final     HCT  Date Value Range Status  06/26/2013 34.9* 36.0 - 46.0 % Final   Brief Hospital Course Allison Hancock a 32 yo G3 now P3003 was admitted on 06/25/13 for spontaneous onset of labor. She was in active labor and progressed well to normal vaginal delivery of a healthy female over an intact perineum. Placenta was delivered intact without complications. On PPD#1 patient reported some swelling and irritation of bilateral nipples that she had with breastfeeding her other 2 children. She reports some improvement with hydrocortisone cream which is prescribed for her to continue after discharge. On day of discharge she reports ambulating well, tolerating PO, urinating well, having regular BM, no pain and mild bleeding.  Physical Exam:  General: alert, cooperative and no distress Lochia: appropriate Uterine Fundus: firm Incision: n/a DVT Evaluation: No evidence of DVT seen on physical exam. Negative Homan's sign. No significant calf/ankle edema.  Discharge Diagnoses: Term Pregnancy-delivered  Discharge Information: Date: 06/27/2013 Activity: pelvic rest Diet: routine Medications: PNV, Ibuprofen and 1% hydrocortisone cream Condition: stable Instructions: refer to practice specific booklet Discharge to: home Breastfeeding Plans to have vasectomy for contraception  Follow-up Information   [facility] up with [facility] In 5 weeks. (Call as soon as possible to make appointment for postpartum folllow up in 4-6 weeks)    Specialty:  Obstetrics and Gynecology   Contact information:   [address] [place] [address] [phone]     Newborn Data: Live  born female  Birth Weight: 7 lb 3.3 oz (3270 g) APGAR: 8, 9  Home with mother.  Pior, Allison Hancock 06/27/2013, 7:45 AM  I have seen and examined this patient and I agree with the above. Allison Hancock, Allison Hancock 8:16 AM 06/27/2013

## 2013-06-28 ENCOUNTER — Telehealth: Payer: Self-pay | Admitting: *Deleted

## 2013-06-28 NOTE — Telephone Encounter (Signed)
[facility] calling to clarify prescriptions sent by Dr. Jarvis Newcomer earlier today. Can be reached at [phone].

## 2013-06-28 NOTE — Discharge Summary (Signed)
`````  Attestation of Attending Supervision of Advanced Practitioner: Evaluation and management procedures were performed by the PA/NP/CNM/OB Fellow under my supervision/collaboration. Chart reviewed and agree with management and plan.  Colden Samaras V 06/28/2013 8:30 PM

## 2013-07-02 ENCOUNTER — Encounter: Payer: PRIVATE HEALTH INSURANCE | Admitting: Obstetrics and Gynecology

## 2013-07-18 ENCOUNTER — Telehealth: Payer: Self-pay | Admitting: *Deleted

## 2013-07-18 DIAGNOSIS — N61 Mastitis without abscess: Secondary | ICD-10-CM

## 2013-07-18 MED ORDER — DICLOXACILLIN SODIUM 500 MG PO CAPS: 500.0000 mg | ORAL_CAPSULE | Freq: Four times a day (QID) | ORAL | Status: DC

## 2013-07-18 NOTE — Telephone Encounter (Signed)
Patient is having a case of mastitis she has had it with each of her other two children in the past so she is very familiar with the symptoms.  She is unable to come to the office because her car will not start with the cold weather we are having today.  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  She will complete the full course of medication and use warm compresses and continue to pump her milk.

## 2013-08-13 ENCOUNTER — Encounter: Payer: Self-pay | Admitting: Obstetrics & Gynecology

## 2013-08-13 ENCOUNTER — Ambulatory Visit (INDEPENDENT_AMBULATORY_CARE_PROVIDER_SITE_OTHER): Payer: BC Managed Care – PPO | Admitting: Obstetrics & Gynecology

## 2013-08-13 NOTE — Progress Notes (Signed)
   Subjective:    Patient ID: Boyce MediciFranziska Pigue, female    DOB: [birth year], 33 y.o.   MRN: [id number]  HPI  33 yo MW P3 ( 6week, 1 year, 33 yo) here for pp exam. She had an uncomplicated NSVD. Her husband had a vasectomy last week. She plans to use condoms until he is clear. She scored 0 on her depression test. She denies any pp problems. Daughter is doing well. She quit pumping yesterday (due to sore nipples). She reports normal bowel and bladder function.  Review of Systems     Objective:   Physical Exam Normal perineum and bimanual exam       Assessment & Plan:  PP- doing will return for an annual in 3 months.

## 2013-11-02 IMAGING — US US OB DETAIL+14 WK
2 series · 12 of 28 positions shown · non-contrast
Comparison: none

[Series 1: us ob detail +14 wk · 11 of 73 slices shown (1 of 2)]
[im 4/73]
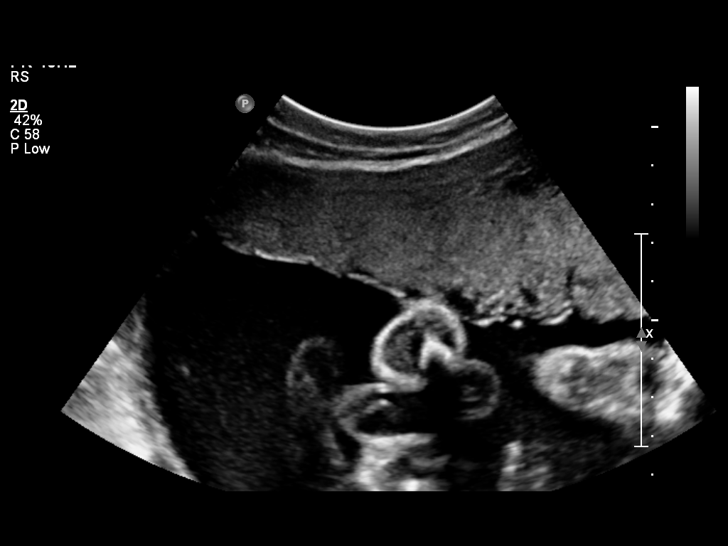
[im 10/73]
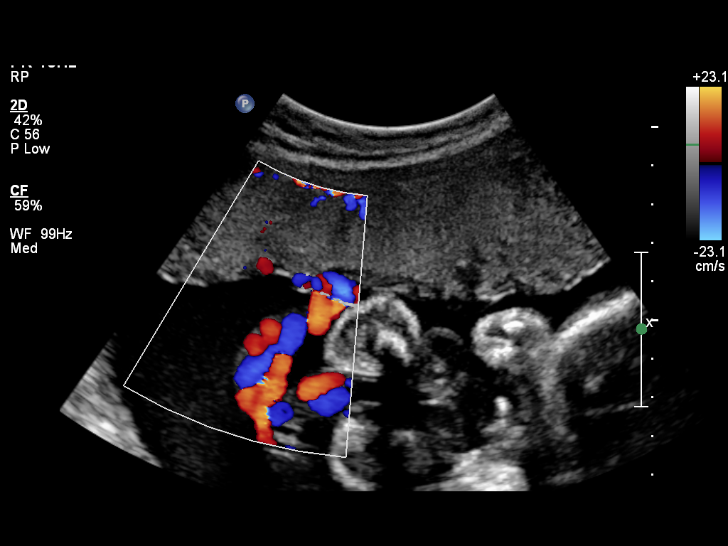
[im 16/73]
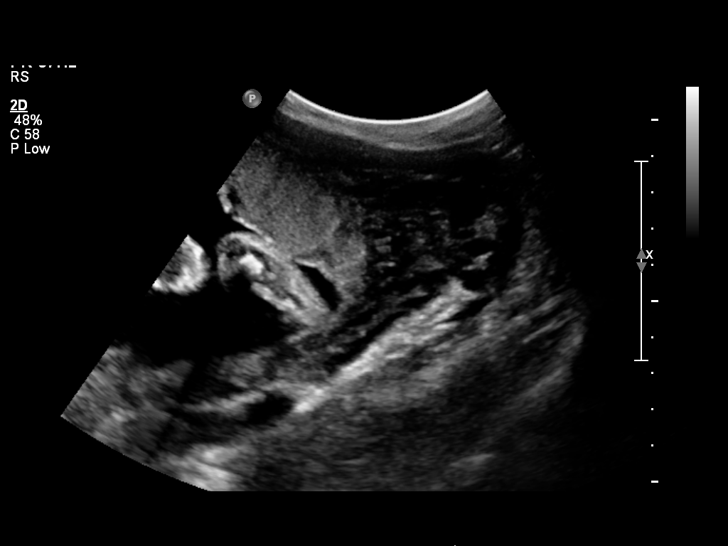
[im 25/73]
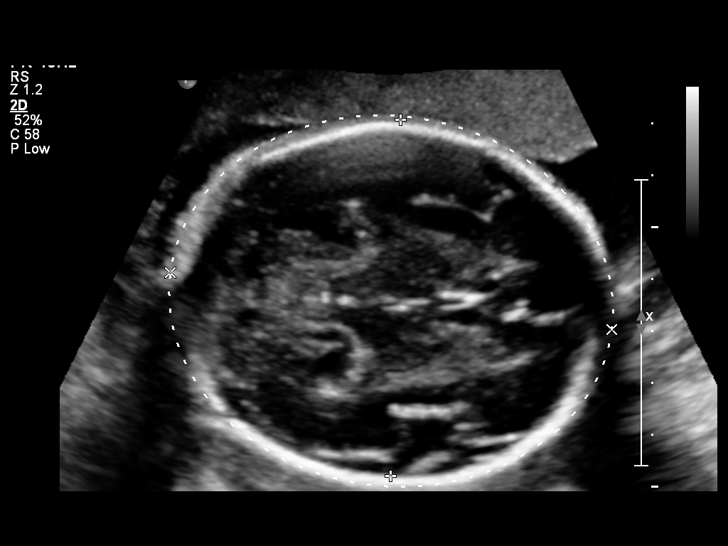
[im 31/73]
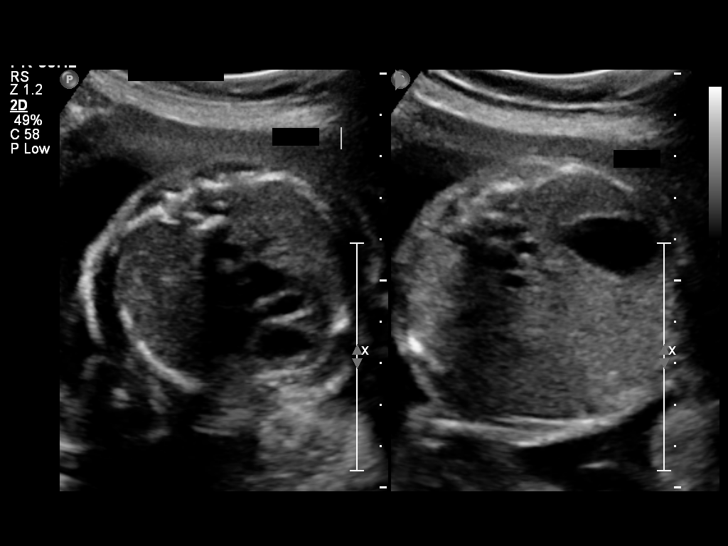
[im 37/73]
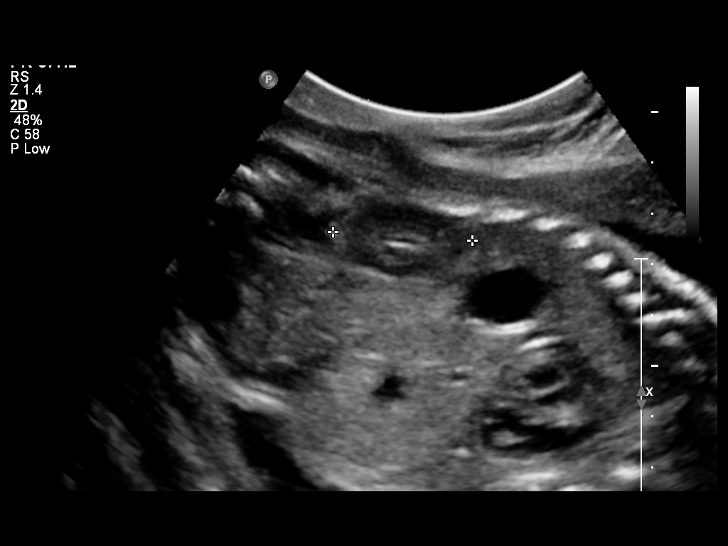
[im 46/73]
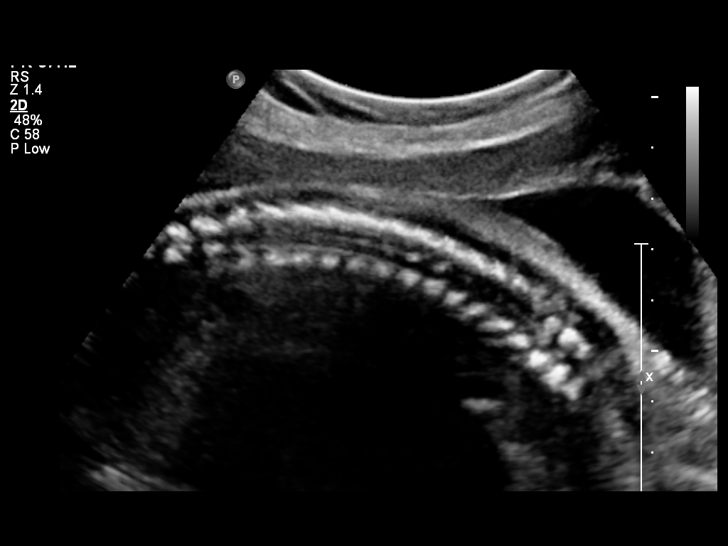
[im 52/73]
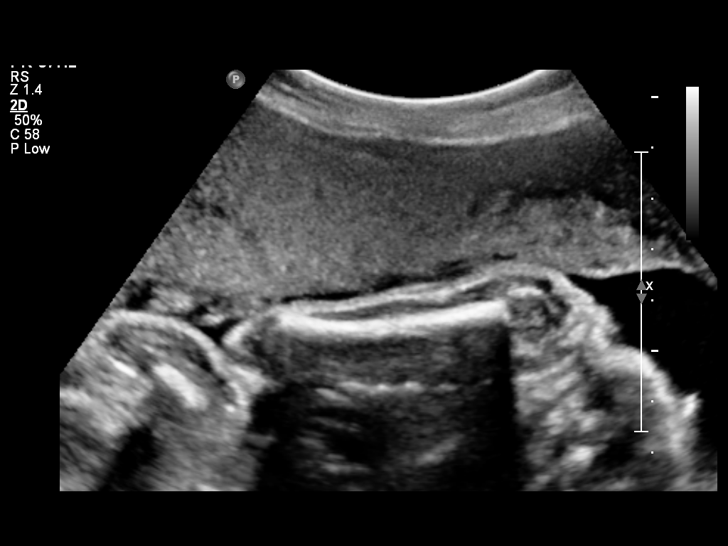
[im 58/73]
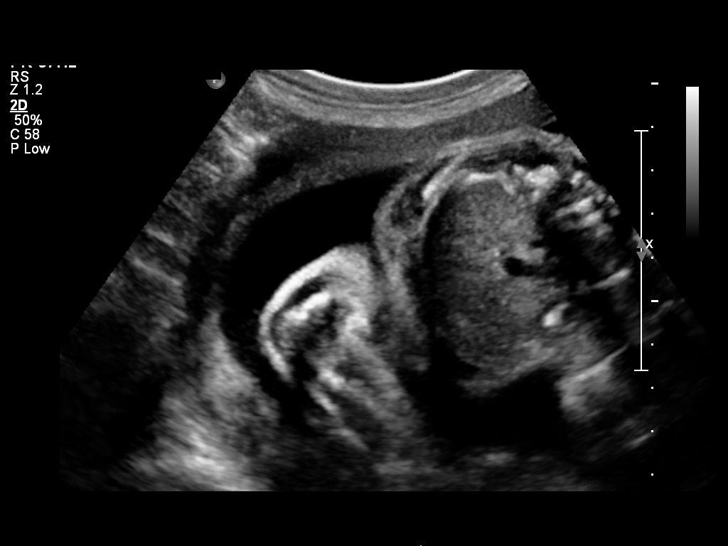
[im 67/73]
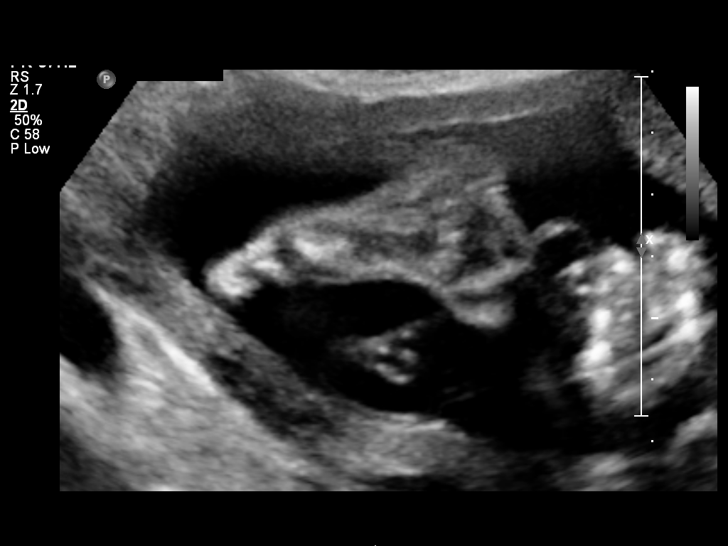
[im 73/73]
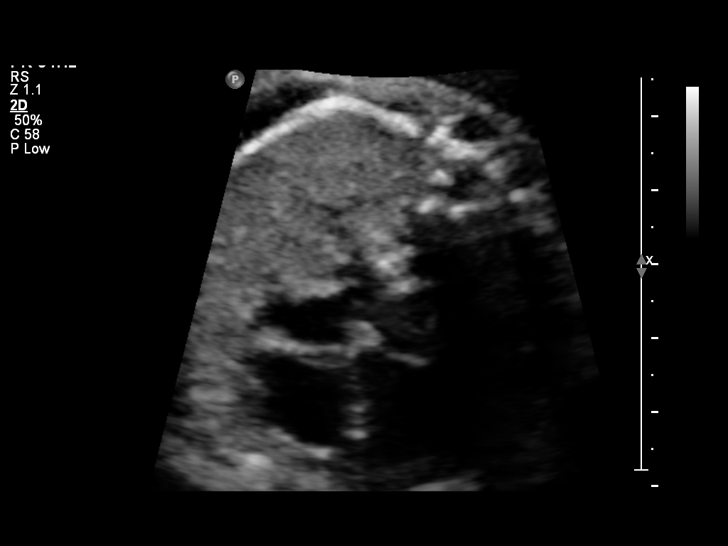

[Series 1: us ob detail +14 wk · 1 of 9 slices shown (2 of 2)]
[im 5/9]
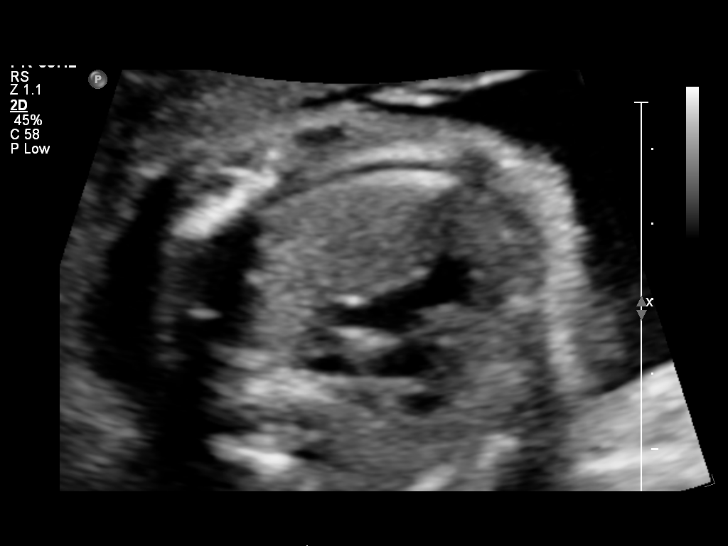

[12 of 28 positions shown; findings below may reference images not displayed]

OBSTETRICS REPORT
                      (Signed Final 04/02/2013 [DATE])

Service(s) Provided

 US OB DETAIL + 14 WK                                  76811.0
Indications

 Detailed fetal anatomic survey
 Recent pregnancy (has 8 month old)
Fetal Evaluation

 Num Of Fetuses:    1
 Fetal Heart Rate:  150                          bpm
 Cardiac Activity:  Observed
 Presentation:      Cephalic
 Placenta:          Anterior, above cervical os
 P. Cord            Visualized
 Insertion:

 Amniotic Fluid
 AFI FV:      Subjectively within normal limits
                                             Larg Pckt:     6.9  cm
Biometry

 BPD:     68.5  mm     G. Age:  27w 4d                CI:        75.76   70 - 86
                                                      FL/HC:      21.1   18.6 -

 HC:     249.5  mm     G. Age:  27w 0d       47  %    HC/AC:      1.08   1.04 -

 AC:       230  mm     G. Age:  27w 3d       70  %    FL/BPD:     76.9   71 - 87
 FL:      52.7  mm     G. Age:  28w 0d       81  %    FL/AC:      22.9   20 - 24
 HUM:     44.8  mm     G. Age:  26w 4d       49  %

 Est. FW:    8218  gm      2 lb 7 oz     76  %
Gestational Age

 LMP:           26w 3d        Date:  09/29/12                 EDD:   07/06/13
 U/S Today:     27w 4d                                        EDD:   06/28/13
 Best:          26w 3d     Det. By:  LMP  (09/29/12)          EDD:   07/06/13
Anatomy

 Cranium:          Appears normal         Aortic Arch:      Appears normal
 Fetal Cavum:      Appears normal         Ductal Arch:      Not well visualized
 Ventricles:       Appears normal         Diaphragm:        Appears normal
 Choroid Plexus:   Appears normal         Stomach:          Appears normal, left
                                                            sided
 Cerebellum:       Appears normal         Abdomen:          Appears normal
 Posterior Fossa:  Appears normal         Abdominal Wall:   Not well visualized
 Nuchal Fold:      Not applicable (>20    Cord Vessels:     Appears normal (3
                   wks GA)                                  vessel cord)
 Face:             Orbits appear          Kidneys:          Appear normal
                   normal
 Lips:             Not well visualized    Bladder:          Appears normal
 Heart:            Appears normal         Spine:            Appears normal
                   (4CH, axis, and
                   situs)
 RVOT:             Not well visualized    Lower             Appears normal
                                          Extremities:
 LVOT:             Appears normal         Upper             Appears normal
                                          Extremities:

 Other:  Fetus appears to be a female. Heels visualized. Technically difficult
         due to fetal position.
Targeted Anatomy

 Fetal Central Nervous System
 Lat. Ventricles:
Cervix Uterus Adnexa

 Cervical Length:    4.37     cm

 Cervix:       Normal appearance by transabdominal scan.

 Left Ovary:    Not visualized.
 Right Ovary:   Not visualized.

 Adnexa:     No abnormality visualized.
Impression

 Single IUP at 26 [DATE] weeks
 Normal fetal anatomic survey; somewhat limited views of the
 face (lips) and heart were obtained due to fetal position
 Fetal growth is appropriate (76th %tile)
 Anterior placenta without previa
 Normal amniotic fluid volume
Recommendations

 Recommend follow-up ultrasound examination in 4 weeks to
 reevaluate fetal anatomy (heart, face)

## 2014-04-01 ENCOUNTER — Encounter: Payer: Self-pay | Admitting: Family Medicine

## 2014-04-01 ENCOUNTER — Ambulatory Visit (INDEPENDENT_AMBULATORY_CARE_PROVIDER_SITE_OTHER): Payer: BC Managed Care – PPO | Admitting: Family Medicine

## 2014-04-01 VITALS — BP 112/72 | HR 77 | Ht 63.0 in | Wt 126.4 lb

## 2014-04-01 DIAGNOSIS — Z1151 Encounter for screening for human papillomavirus (HPV): Secondary | ICD-10-CM | POA: Diagnosis not present

## 2014-04-01 DIAGNOSIS — Z124 Encounter for screening for malignant neoplasm of cervix: Secondary | ICD-10-CM | POA: Diagnosis not present

## 2014-04-01 DIAGNOSIS — Z01419 Encounter for gynecological examination (general) (routine) without abnormal findings: Secondary | ICD-10-CM | POA: Diagnosis not present

## 2014-04-01 MED ORDER — MULTIVITAMINS PO CAPS: 1.0000 | ORAL_CAPSULE | Freq: Every day | ORAL | Status: AC

## 2014-04-01 NOTE — Progress Notes (Signed)
  Subjective:     Allison Hancock is a 33 y.o. female and is here for a comprehensive physical exam. The patient reports no problems. Having regular menses which are heavy on day 1-2, but then taper off.  No pain associated with this. No longer nursing.  Infant is 26 months old.  Husband has had a vasectomy.  History   Social History  . Marital Status: Married    Spouse Name: N/A    Number of Children: N/A  . Years of Education: N/A   Occupational History  . Not on file.   Social History Main Topics  . Smoking status: Never Smoker   . Smokeless tobacco: Never Used  . Alcohol Use: No  . Drug Use: No  . Sexual Activity: Yes    Birth Control/ Protection: Surgical   Other Topics Concern  . Not on file   Social History Narrative  . No narrative on file   Health Maintenance  Topic Date Due  . Pap Smear  04/25/1999  . Influenza Vaccine  02/08/2014  . Tetanus/tdap  06/27/2023    The following portions of the patient's history were reviewed and updated as appropriate: allergies, current medications, past family history, past medical history, past social history, past surgical history and problem list.  Review of Systems A comprehensive review of systems was negative.   Objective:    BP 112/72  Pulse 77  Ht  (1.6 m)  Wt 126 lb 6 oz (57.323 kg)  BMI 22.39 kg/m2  LMP 03/18/2014  Breastfeeding? No General appearance: alert, cooperative and appears stated age Head: Normocephalic, without obvious abnormality, atraumatic Neck: no adenopathy, supple, symmetrical, trachea midline and thyroid not enlarged, symmetric, no tenderness/mass/nodules Lungs: clear to auscultation bilaterally Breasts: normal appearance, no masses or tenderness Heart: regular rate and rhythm, S1, S2 normal, no murmur, click, rub or gallop Abdomen: soft, non-tender; bowel sounds normal; no masses,  no organomegaly Pelvic: cervix normal in appearance, external genitalia normal, no adnexal masses or  tenderness, no cervical motion tenderness, uterus normal size, shape, and consistency, vagina normal without discharge and uterus is retroverted and 6 wk size Extremities: extremities normal, atraumatic, no cyanosis or edema Pulses: 2+ and symmetric Skin: Skin color, texture, turgor normal. No rashes or lesions Lymph nodes: Cervical, supraclavicular, and axillary nodes normal. Neurologic: Grossly normal    Assessment:    Healthy female exam.      Plan:     Screening for malignant neoplasm of the cervix - Plan: Cytology - PAP  Routine gynecological examination - Plan: CBC, Comprehensive metabolic panel, TSH, Lipid panel, Cytology - PAP  Declines flu shot  See After Visit Summary for Counseling Recommendations

## 2014-04-01 NOTE — Patient Instructions (Signed)
Preventive Care for Adults A healthy lifestyle and preventive care can promote health and wellness. Preventive health guidelines for women include the following key practices.  A routine yearly physical is a good way to check with your health care provider about your health and preventive screening. It is a chance to share any concerns and updates on your health and to receive a thorough exam.  Visit your dentist for a routine exam and preventive care every 6 months. Brush your teeth twice a day and floss once a day. Good oral hygiene prevents tooth decay and gum disease.  The frequency of eye exams is based on your age, health, family medical history, use of contact lenses, and other factors. Follow your health care provider's recommendations for frequency of eye exams.  Eat a healthy diet. Foods like vegetables, fruits, whole grains, low-fat dairy products, and lean protein foods contain the nutrients you need without too many calories. Decrease your intake of foods high in solid fats, added sugars, and salt. Eat the right amount of calories for you.Get information about a proper diet from your health care provider, if necessary.  Regular physical exercise is one of the most important things you can do for your health. Most adults should get at least 150 minutes of moderate-intensity exercise (any activity that increases your heart rate and causes you to sweat) each week. In addition, most adults need muscle-strengthening exercises on 2 or more days a week.  Maintain a healthy weight. The body mass index (BMI) is a screening tool to identify possible weight problems. It provides an estimate of body fat based on height and weight. Your health care provider can find your BMI and can help you achieve or maintain a healthy weight.For adults 20 years and older:  A BMI below 18.5 is considered underweight.  A BMI of 18.5 to 24.9 is normal.  A BMI of 25 to 29.9 is considered overweight.  A BMI of  30 and above is considered obese.  Maintain normal blood lipids and cholesterol levels by exercising and minimizing your intake of saturated fat. Eat a balanced diet with plenty of fruit and vegetables. Blood tests for lipids and cholesterol should begin at age 76 and be repeated every 5 years. If your lipid or cholesterol levels are high, you are over 50, or you are at high risk for heart disease, you may need your cholesterol levels checked more frequently.Ongoing high lipid and cholesterol levels should be treated with medicines if diet and exercise are not working.  If you smoke, find out from your health care provider how to quit. If you do not use tobacco, do not start.  Lung cancer screening is recommended for adults aged 22-80 years who are at high risk for developing lung cancer because of a history of smoking. A yearly low-dose CT scan of the lungs is recommended for people who have at least a 30-pack-year history of smoking and are a current smoker or have quit within the past 15 years. A pack year of smoking is smoking an average of 1 pack of cigarettes a day for 1 year (for example: 1 pack a day for 30 years or 2 packs a day for 15 years). Yearly screening should continue until the smoker has stopped smoking for at least 15 years. Yearly screening should be stopped for people who develop a health problem that would prevent them from having lung cancer treatment.  If you are pregnant, do not drink alcohol. If you are breastfeeding,  be very cautious about drinking alcohol. If you are not pregnant and choose to drink alcohol, do not have more than 1 drink per day. One drink is considered to be 12 ounces (355 mL) of beer, 5 ounces (148 mL) of wine, or 1.5 ounces (44 mL) of liquor.  Avoid use of street drugs. Do not share needles with anyone. Ask for help if you need support or instructions about stopping the use of drugs.  High blood pressure causes heart disease and increases the risk of  stroke. Your blood pressure should be checked at least every 1 to 2 years. Ongoing high blood pressure should be treated with medicines if weight loss and exercise do not work.  If you are 3-86 years old, ask your health care provider if you should take aspirin to prevent strokes.  Diabetes screening involves taking a blood sample to check your fasting blood sugar level. This should be done once every 3 years, after age 67, if you are within normal weight and without risk factors for diabetes. Testing should be considered at a younger age or be carried out more frequently if you are overweight and have at least 1 risk factor for diabetes.  Breast cancer screening is essential preventive care for women. You should practice "breast self-awareness." This means understanding the normal appearance and feel of your breasts and may include breast self-examination. Any changes detected, no matter how small, should be reported to a health care provider. Women in their 8s and 30s should have a clinical breast exam (CBE) by a health care provider as part of a regular health exam every 1 to 3 years. After age 70, women should have a CBE every year. Starting at age 25, women should consider having a mammogram (breast X-ray test) every year. Women who have a family history of breast cancer should talk to their health care provider about genetic screening. Women at a high risk of breast cancer should talk to their health care providers about having an MRI and a mammogram every year.  Breast cancer gene (BRCA)-related cancer risk assessment is recommended for women who have family members with BRCA-related cancers. BRCA-related cancers include breast, ovarian, tubal, and peritoneal cancers. Having family members with these cancers may be associated with an increased risk for harmful changes (mutations) in the breast cancer genes BRCA1 and BRCA2. Results of the assessment will determine the need for genetic counseling and  BRCA1 and BRCA2 testing.  Routine pelvic exams to screen for cancer are no longer recommended for nonpregnant women who are considered low risk for cancer of the pelvic organs (ovaries, uterus, and vagina) and who do not have symptoms. Ask your health care provider if a screening pelvic exam is right for you.  If you have had past treatment for cervical cancer or a condition that could lead to cancer, you need Pap tests and screening for cancer for at least 20 years after your treatment. If Pap tests have been discontinued, your risk factors (such as having a new sexual partner) need to be reassessed to determine if screening should be resumed. Some women have medical problems that increase the chance of getting cervical cancer. In these cases, your health care provider may recommend more frequent screening and Pap tests.  The HPV test is an additional test that may be used for cervical cancer screening. The HPV test looks for the virus that can cause the cell changes on the cervix. The cells collected during the Pap test can be  tested for HPV. The HPV test could be used to screen women aged 30 years and older, and should be used in women of any age who have unclear Pap test results. After the age of 30, women should have HPV testing at the same frequency as a Pap test.  Colorectal cancer can be detected and often prevented. Most routine colorectal cancer screening begins at the age of 50 years and continues through age 75 years. However, your health care provider may recommend screening at an earlier age if you have risk factors for colon cancer. On a yearly basis, your health care provider may provide home test kits to check for hidden blood in the stool. Use of a small camera at the end of a tube, to directly examine the colon (sigmoidoscopy or colonoscopy), can detect the earliest forms of colorectal cancer. Talk to your health care provider about this at age 50, when routine screening begins. Direct  exam of the colon should be repeated every 5-10 years through age 75 years, unless early forms of pre-cancerous polyps or small growths are found.  People who are at an increased risk for hepatitis B should be screened for this virus. You are considered at high risk for hepatitis B if:  You were born in a country where hepatitis B occurs often. Talk with your health care provider about which countries are considered high risk.  Your parents were born in a high-risk country and you have not received a shot to protect against hepatitis B (hepatitis B vaccine).  You have HIV or AIDS.  You use needles to inject street drugs.  You live with, or have sex with, someone who has hepatitis B.  You get hemodialysis treatment.  You take certain medicines for conditions like cancer, organ transplantation, and autoimmune conditions.  Hepatitis C blood testing is recommended for all people born from 1945 through 1965 and any individual with known risks for hepatitis C.  Practice safe sex. Use condoms and avoid high-risk sexual practices to reduce the spread of sexually transmitted infections (STIs). STIs include gonorrhea, chlamydia, syphilis, trichomonas, herpes, HPV, and human immunodeficiency virus (HIV). Herpes, HIV, and HPV are viral illnesses that have no cure. They can result in disability, cancer, and death.  You should be screened for sexually transmitted illnesses (STIs) including gonorrhea and chlamydia if:  You are sexually active and are younger than 24 years.  You are older than 24 years and your health care provider tells you that you are at risk for this type of infection.  Your sexual activity has changed since you were last screened and you are at an increased risk for chlamydia or gonorrhea. Ask your health care provider if you are at risk.  If you are at risk of being infected with HIV, it is recommended that you take a prescription medicine daily to prevent HIV infection. This is  called preexposure prophylaxis (PrEP). You are considered at risk if:  You are a heterosexual woman, are sexually active, and are at increased risk for HIV infection.  You take drugs by injection.  You are sexually active with a partner who has HIV.  Talk with your health care provider about whether you are at high risk of being infected with HIV. If you choose to begin PrEP, you should first be tested for HIV. You should then be tested every 3 months for as long as you are taking PrEP.  Osteoporosis is a disease in which the bones lose minerals and strength   with aging. This can result in serious bone fractures or breaks. The risk of osteoporosis can be identified using a bone density scan. Women ages 65 years and over and women at risk for fractures or osteoporosis should discuss screening with their health care providers. Ask your health care provider whether you should take a calcium supplement or vitamin D to reduce the rate of osteoporosis.  Menopause can be associated with physical symptoms and risks. Hormone replacement therapy is available to decrease symptoms and risks. You should talk to your health care provider about whether hormone replacement therapy is right for you.  Use sunscreen. Apply sunscreen liberally and repeatedly throughout the day. You should seek shade when your shadow is shorter than you. Protect yourself by wearing long sleeves, pants, a wide-brimmed hat, and sunglasses year round, whenever you are outdoors.  Once a month, do a whole body skin exam, using a mirror to look at the skin on your back. Tell your health care provider of new moles, moles that have irregular borders, moles that are larger than a pencil eraser, or moles that have changed in shape or color.  Stay current with required vaccines (immunizations).  Influenza vaccine. All adults should be immunized every year.  Tetanus, diphtheria, and acellular pertussis (Td, Tdap) vaccine. Pregnant women should  receive 1 dose of Tdap vaccine during each pregnancy. The dose should be obtained regardless of the length of time since the last dose. Immunization is preferred during the 27th-36th week of gestation. An adult who has not previously received Tdap or who does not know her vaccine status should receive 1 dose of Tdap. This initial dose should be followed by tetanus and diphtheria toxoids (Td) booster doses every 10 years. Adults with an unknown or incomplete history of completing a 3-dose immunization series with Td-containing vaccines should begin or complete a primary immunization series including a Tdap dose. Adults should receive a Td booster every 10 years.  Varicella vaccine. An adult without evidence of immunity to varicella should receive 2 doses or a second dose if she has previously received 1 dose. Pregnant females who do not have evidence of immunity should receive the first dose after pregnancy. This first dose should be obtained before leaving the health care facility. The second dose should be obtained 4-8 weeks after the first dose.  Human papillomavirus (HPV) vaccine. Females aged 13-26 years who have not received the vaccine previously should obtain the 3-dose series. The vaccine is not recommended for use in pregnant females. However, pregnancy testing is not needed before receiving a dose. If a female is found to be pregnant after receiving a dose, no treatment is needed. In that case, the remaining doses should be delayed until after the pregnancy. Immunization is recommended for any person with an immunocompromised condition through the age of 26 years if she did not get any or all doses earlier. During the 3-dose series, the second dose should be obtained 4-8 weeks after the first dose. The third dose should be obtained 24 weeks after the first dose and 16 weeks after the second dose.  Zoster vaccine. One dose is recommended for adults aged 60 years or older unless certain conditions are  present.  Measles, mumps, and rubella (MMR) vaccine. Adults born before 1957 generally are considered immune to measles and mumps. Adults born in 1957 or later should have 1 or more doses of MMR vaccine unless there is a contraindication to the vaccine or there is laboratory evidence of immunity to   each of the three diseases. A routine second dose of MMR vaccine should be obtained at least 28 days after the first dose for students attending postsecondary schools, health care workers, or international travelers. People who received inactivated measles vaccine or an unknown type of measles vaccine during 1963-1967 should receive 2 doses of MMR vaccine. People who received inactivated mumps vaccine or an unknown type of mumps vaccine before 1979 and are at high risk for mumps infection should consider immunization with 2 doses of MMR vaccine. For females of childbearing age, rubella immunity should be determined. If there is no evidence of immunity, females who are not pregnant should be vaccinated. If there is no evidence of immunity, females who are pregnant should delay immunization until after pregnancy. Unvaccinated health care workers born before 1957 who lack laboratory evidence of measles, mumps, or rubella immunity or laboratory confirmation of disease should consider measles and mumps immunization with 2 doses of MMR vaccine or rubella immunization with 1 dose of MMR vaccine.  Pneumococcal 13-valent conjugate (PCV13) vaccine. When indicated, a person who is uncertain of her immunization history and has no record of immunization should receive the PCV13 vaccine. An adult aged 19 years or older who has certain medical conditions and has not been previously immunized should receive 1 dose of PCV13 vaccine. This PCV13 should be followed with a dose of pneumococcal polysaccharide (PPSV23) vaccine. The PPSV23 vaccine dose should be obtained at least 8 weeks after the dose of PCV13 vaccine. An adult aged 19  years or older who has certain medical conditions and previously received 1 or more doses of PPSV23 vaccine should receive 1 dose of PCV13. The PCV13 vaccine dose should be obtained 1 or more years after the last PPSV23 vaccine dose.  Pneumococcal polysaccharide (PPSV23) vaccine. When PCV13 is also indicated, PCV13 should be obtained first. All adults aged 65 years and older should be immunized. An adult younger than age 65 years who has certain medical conditions should be immunized. Any person who resides in a nursing home or long-term care facility should be immunized. An adult smoker should be immunized. People with an immunocompromised condition and certain other conditions should receive both PCV13 and PPSV23 vaccines. People with human immunodeficiency virus (HIV) infection should be immunized as soon as possible after diagnosis. Immunization during chemotherapy or radiation therapy should be avoided. Routine use of PPSV23 vaccine is not recommended for American Indians, [place] Natives, or people younger than 65 years unless there are medical conditions that require PPSV23 vaccine. When indicated, people who have unknown immunization and have no record of immunization should receive PPSV23 vaccine. One-time revaccination 5 years after the first dose of PPSV23 is recommended for people aged 19-64 years who have chronic kidney failure, nephrotic syndrome, asplenia, or immunocompromised conditions. People who received 1-2 doses of PPSV23 before age 65 years should receive another dose of PPSV23 vaccine at age 65 years or later if at least 5 years have passed since the previous dose. Doses of PPSV23 are not needed for people immunized with PPSV23 at or after age 65 years.  Meningococcal vaccine. Adults with asplenia or persistent complement component deficiencies should receive 2 doses of quadrivalent meningococcal conjugate (MenACWY-D) vaccine. The doses should be obtained at least 2 months apart.  Microbiologists working with certain meningococcal bacteria, military recruits, people at risk during an outbreak, and people who travel to or live in countries with a high rate of meningitis should be immunized. A first-year college student up through age   21 years who is living in a residence hall should receive a dose if she did not receive a dose on or after her 16th birthday. Adults who have certain high-risk conditions should receive one or more doses of vaccine.  Hepatitis A vaccine. Adults who wish to be protected from this disease, have certain high-risk conditions, work with hepatitis A-infected animals, work in hepatitis A research labs, or travel to or work in countries with a high rate of hepatitis A should be immunized. Adults who were previously unvaccinated and who anticipate close contact with an international adoptee during the first 60 days after arrival in the [place] from a country with a high rate of hepatitis A should be immunized.  Hepatitis B vaccine. Adults who wish to be protected from this disease, have certain high-risk conditions, may be exposed to blood or other infectious body fluids, are household contacts or sex partners of hepatitis B positive people, are clients or workers in certain care facilities, or travel to or work in countries with a high rate of hepatitis B should be immunized.  Haemophilus influenzae type b (Hib) vaccine. A previously unvaccinated person with asplenia or sickle cell disease or having a scheduled splenectomy should receive 1 dose of Hib vaccine. Regardless of previous immunization, a recipient of a hematopoietic stem cell transplant should receive a 3-dose series 6-12 months after her successful transplant. Hib vaccine is not recommended for adults with HIV infection. Preventive Services / Frequency Ages 64 to 68 years  Blood pressure check.** / Every 1 to 2 years.  Lipid and cholesterol check.** / Every 5 years beginning at age  22.  Clinical breast exam.** / Every 3 years for women in their 88s and 53s.  BRCA-related cancer risk assessment.** / For women who have family members with a BRCA-related cancer (breast, ovarian, tubal, or peritoneal cancers).  Pap test.** / Every 2 years from ages 90 through 51. Every 3 years starting at age 21 through age 56 or 3 with a history of 3 consecutive normal Pap tests.  HPV screening.** / Every 3 years from ages 24 through ages 1 to 46 with a history of 3 consecutive normal Pap tests.  Hepatitis C blood test.** / For any individual with known risks for hepatitis C.  Skin self-exam. / Monthly.  Influenza vaccine. / Every year.  Tetanus, diphtheria, and acellular pertussis (Tdap, Td) vaccine.** / Consult your health care provider. Pregnant women should receive 1 dose of Tdap vaccine during each pregnancy. 1 dose of Td every 10 years.  Varicella vaccine.** / Consult your health care provider. Pregnant females who do not have evidence of immunity should receive the first dose after pregnancy.  HPV vaccine. / 3 doses over 6 months, if 72 and younger. The vaccine is not recommended for use in pregnant females. However, pregnancy testing is not needed before receiving a dose.  Measles, mumps, rubella (MMR) vaccine.** / You need at least 1 dose of MMR if you were born in 1957 or later. You may also need a 2nd dose. For females of childbearing age, rubella immunity should be determined. If there is no evidence of immunity, females who are not pregnant should be vaccinated. If there is no evidence of immunity, females who are pregnant should delay immunization until after pregnancy.  Pneumococcal 13-valent conjugate (PCV13) vaccine.** / Consult your health care provider.  Pneumococcal polysaccharide (PPSV23) vaccine.** / 1 to 2 doses if you smoke cigarettes or if you have certain conditions.  Meningococcal vaccine.** /  1 dose if you are age 19 to 21 years and a first-year college  student living in a residence hall, or have one of several medical conditions, you need to get vaccinated against meningococcal disease. You may also need additional booster doses.  Hepatitis A vaccine.** / Consult your health care provider.  Hepatitis B vaccine.** / Consult your health care provider.  Haemophilus influenzae type b (Hib) vaccine.** / Consult your health care provider. Ages 40 to 64 years  Blood pressure check.** / Every 1 to 2 years.  Lipid and cholesterol check.** / Every 5 years beginning at age 20 years.  Lung cancer screening. / Every year if you are aged 55-80 years and have a 30-pack-year history of smoking and currently smoke or have quit within the past 15 years. Yearly screening is stopped once you have quit smoking for at least 15 years or develop a health problem that would prevent you from having lung cancer treatment.  Clinical breast exam.** / Every year after age 40 years.  BRCA-related cancer risk assessment.** / For women who have family members with a BRCA-related cancer (breast, ovarian, tubal, or peritoneal cancers).  Mammogram.** / Every year beginning at age 40 years and continuing for as long as you are in good health. Consult with your health care provider.  Pap test.** / Every 3 years starting at age 30 years through age 65 or 70 years with a history of 3 consecutive normal Pap tests.  HPV screening.** / Every 3 years from ages 30 years through ages 65 to 70 years with a history of 3 consecutive normal Pap tests.  Fecal occult blood test (FOBT) of stool. / Every year beginning at age 50 years and continuing until age 75 years. You may not need to do this test if you get a colonoscopy every 10 years.  Flexible sigmoidoscopy or colonoscopy.** / Every 5 years for a flexible sigmoidoscopy or every 10 years for a colonoscopy beginning at age 50 years and continuing until age 75 years.  Hepatitis C blood test.** / For all people born from 1945 through  1965 and any individual with known risks for hepatitis C.  Skin self-exam. / Monthly.  Influenza vaccine. / Every year.  Tetanus, diphtheria, and acellular pertussis (Tdap/Td) vaccine.** / Consult your health care provider. Pregnant women should receive 1 dose of Tdap vaccine during each pregnancy. 1 dose of Td every 10 years.  Varicella vaccine.** / Consult your health care provider. Pregnant females who do not have evidence of immunity should receive the first dose after pregnancy.  Zoster vaccine.** / 1 dose for adults aged 60 years or older.  Measles, mumps, rubella (MMR) vaccine.** / You need at least 1 dose of MMR if you were born in 1957 or later. You may also need a 2nd dose. For females of childbearing age, rubella immunity should be determined. If there is no evidence of immunity, females who are not pregnant should be vaccinated. If there is no evidence of immunity, females who are pregnant should delay immunization until after pregnancy.  Pneumococcal 13-valent conjugate (PCV13) vaccine.** / Consult your health care provider.  Pneumococcal polysaccharide (PPSV23) vaccine.** / 1 to 2 doses if you smoke cigarettes or if you have certain conditions.  Meningococcal vaccine.** / Consult your health care provider.  Hepatitis A vaccine.** / Consult your health care provider.  Hepatitis B vaccine.** / Consult your health care provider.  Haemophilus influenzae type b (Hib) vaccine.** / Consult your health care provider. Ages 65   years and over  Blood pressure check.** / Every 1 to 2 years.  Lipid and cholesterol check.** / Every 5 years beginning at age 22 years.  Lung cancer screening. / Every year if you are aged 73-80 years and have a 30-pack-year history of smoking and currently smoke or have quit within the past 15 years. Yearly screening is stopped once you have quit smoking for at least 15 years or develop a health problem that would prevent you from having lung cancer  treatment.  Clinical breast exam.** / Every year after age 4 years.  BRCA-related cancer risk assessment.** / For women who have family members with a BRCA-related cancer (breast, ovarian, tubal, or peritoneal cancers).  Mammogram.** / Every year beginning at age 40 years and continuing for as long as you are in good health. Consult with your health care provider.  Pap test.** / Every 3 years starting at age 9 years through age 34 or 91 years with 3 consecutive normal Pap tests. Testing can be stopped between 65 and 70 years with 3 consecutive normal Pap tests and no abnormal Pap or HPV tests in the past 10 years.  HPV screening.** / Every 3 years from ages 57 years through ages 64 or 45 years with a history of 3 consecutive normal Pap tests. Testing can be stopped between 65 and 70 years with 3 consecutive normal Pap tests and no abnormal Pap or HPV tests in the past 10 years.  Fecal occult blood test (FOBT) of stool. / Every year beginning at age 15 years and continuing until age 17 years. You may not need to do this test if you get a colonoscopy every 10 years.  Flexible sigmoidoscopy or colonoscopy.** / Every 5 years for a flexible sigmoidoscopy or every 10 years for a colonoscopy beginning at age 86 years and continuing until age 71 years.  Hepatitis C blood test.** / For all people born from [birth year] through 1965 and any individual with known risks for hepatitis C.  Osteoporosis screening.** / A one-time screening for women ages 83 years and over and women at risk for fractures or osteoporosis.  Skin self-exam. / Monthly.  Influenza vaccine. / Every year.  Tetanus, diphtheria, and acellular pertussis (Tdap/Td) vaccine.** / 1 dose of Td every 10 years.  Varicella vaccine.** / Consult your health care provider.  Zoster vaccine.** / 1 dose for adults aged 61 years or older.  Pneumococcal 13-valent conjugate (PCV13) vaccine.** / Consult your health care provider.  Pneumococcal  polysaccharide (PPSV23) vaccine.** / 1 dose for all adults aged 28 years and older.  Meningococcal vaccine.** / Consult your health care provider.  Hepatitis A vaccine.** / Consult your health care provider.  Hepatitis B vaccine.** / Consult your health care provider.  Haemophilus influenzae type b (Hib) vaccine.** / Consult your health care provider. ** Family history and personal history of risk and conditions may change your health care provider's recommendations. Document Released: 08/23/2001 Document Revised: 11/11/2013 Document Reviewed: 11/22/2010 [facility] Patient Information 2015 [place], [place]. This information is not intended to replace advice given to you by your health care provider. Make sure you discuss any questions you have with your health care provider.

## 2014-04-02 LAB — CYTOLOGY - PAP

## 2014-04-03 ENCOUNTER — Other Ambulatory Visit: Payer: BC Managed Care – PPO

## 2014-04-07 ENCOUNTER — Other Ambulatory Visit (INDEPENDENT_AMBULATORY_CARE_PROVIDER_SITE_OTHER): Payer: BC Managed Care – PPO | Admitting: *Deleted

## 2014-04-07 DIAGNOSIS — Z130 Encounter for screening for diseases of the blood and blood-forming organs and certain disorders involving the immune mechanism: Secondary | ICD-10-CM

## 2014-04-07 DIAGNOSIS — Z1329 Encounter for screening for other suspected endocrine disorder: Secondary | ICD-10-CM

## 2014-04-07 DIAGNOSIS — Z13220 Encounter for screening for lipoid disorders: Secondary | ICD-10-CM | POA: Diagnosis not present

## 2014-04-07 DIAGNOSIS — Z13228 Encounter for screening for other metabolic disorders: Secondary | ICD-10-CM

## 2014-04-07 DIAGNOSIS — Z01419 Encounter for gynecological examination (general) (routine) without abnormal findings: Secondary | ICD-10-CM | POA: Diagnosis not present

## 2014-04-07 NOTE — Progress Notes (Signed)
Patient is here for fasting labs

## 2014-04-07 NOTE — Addendum Note (Signed)
Addended by: Barbara Cower on: 04/07/2014 11:05 AM   Modules accepted: Orders

## 2014-04-08 LAB — LIPID PANEL
Cholesterol: 143 mg/dL (ref 0–200)
HDL: 41 mg/dL (ref >39)
LDL CALC: 59 mg/dL (ref 0–99)
Total CHOL/HDL Ratio: 3.5 Ratio
Triglycerides: 214 mg/dL — ABNORMAL HIGH (ref <150)
VLDL: 43 mg/dL — ABNORMAL HIGH (ref 0–40)

## 2014-04-08 LAB — TSH: TSH: 1.375 u[IU]/mL (ref 0.350–4.500)

## 2014-04-08 LAB — COMPREHENSIVE METABOLIC PANEL
ALBUMIN: 4.1 g/dL (ref 3.5–5.2)
ALT: 24 U/L (ref 0–35)
AST: 30 U/L (ref 0–37)
Alkaline Phosphatase: 46 U/L (ref 39–117)
BUN: 12 mg/dL (ref 6–23)
CALCIUM: 8.9 mg/dL (ref 8.4–10.5)
CO2: 23 mEq/L (ref 19–32)
CREATININE: 0.59 mg/dL (ref 0.50–1.10)
Chloride: 109 mEq/L (ref 96–112)
Glucose, Bld: 91 mg/dL (ref 70–99)
POTASSIUM: 4 meq/L (ref 3.5–5.3)
Sodium: 142 mEq/L (ref 135–145)
TOTAL PROTEIN: 6.7 g/dL (ref 6.0–8.3)
Total Bilirubin: 0.4 mg/dL (ref 0.2–1.2)

## 2014-04-08 LAB — CBC
HCT: 40.1 % (ref 36.0–46.0)
Hemoglobin: 13.3 g/dL (ref 12.0–15.0)
MCH: 30 pg (ref 26.0–34.0)
MCHC: 33.2 g/dL (ref 30.0–36.0)
MCV: 90.3 fL (ref 78.0–100.0)
PLATELETS: 272 10*3/uL (ref 150–400)
RBC: 4.44 MIL/uL (ref 3.87–5.11)
RDW: 13.2 % (ref 11.5–15.5)
WBC: 5.3 10*3/uL (ref 4.0–10.5)

## 2014-04-10 ENCOUNTER — Telehealth: Payer: Self-pay | Admitting: *Deleted

## 2014-04-10 NOTE — Telephone Encounter (Signed)
Left message on patient voicemail that all results are normal. If patient has any questions she can call back..Marland Kitchen

## 2014-04-10 NOTE — Telephone Encounter (Signed)
Message copied by Grayland OrmondHINTON, Jaggar Benko C on Thu Apr 10, 2014 11:35 AM ------      Message from: Reva BoresPRATT, TANYA S      Created: Tue Apr 08, 2014  1:23 PM       normal labs and pap please inform pt. ------

## 2014-05-12 ENCOUNTER — Encounter: Payer: Self-pay | Admitting: Family Medicine

## 2015-01-27 ENCOUNTER — Ambulatory Visit: Payer: Self-pay | Admitting: Internal Medicine
